# Patient Record
Sex: Male | Born: 1960 | Race: Black or African American | Hispanic: No | Marital: Single | Smoking: Current every day smoker
Health system: Southern US, Community
[De-identification: ages and names within clinical notes are randomized; demographics above are authoritative.]

## PROBLEM LIST (undated history)

## (undated) DIAGNOSIS — S62109A Fracture of unspecified carpal bone, unspecified wrist, initial encounter for closed fracture: Secondary | ICD-10-CM

---

## 2007-07-20 ENCOUNTER — Emergency Department (HOSPITAL_COMMUNITY): Admission: EM | Admit: 2007-07-20 | Discharge: 2007-07-20 | Payer: Self-pay | Admitting: Emergency Medicine

## 2012-01-30 ENCOUNTER — Emergency Department (HOSPITAL_COMMUNITY): Payer: Self-pay

## 2012-01-30 ENCOUNTER — Encounter (HOSPITAL_COMMUNITY): Payer: Self-pay | Admitting: *Deleted

## 2012-01-30 ENCOUNTER — Emergency Department (HOSPITAL_COMMUNITY)
Admission: EM | Admit: 2012-01-30 | Discharge: 2012-01-31 | Disposition: A | Discharge status: Home / Self Care | Payer: Self-pay | Attending: Emergency Medicine | Admitting: Emergency Medicine

## 2012-01-30 DIAGNOSIS — W19XXXA Unspecified fall, initial encounter: Secondary | ICD-10-CM | POA: Insufficient documentation

## 2012-01-30 DIAGNOSIS — S62009A Unspecified fracture of navicular [scaphoid] bone of unspecified wrist, initial encounter for closed fracture: Secondary | ICD-10-CM | POA: Insufficient documentation

## 2012-01-30 DIAGNOSIS — Y9302 Activity, running: Secondary | ICD-10-CM | POA: Insufficient documentation

## 2012-01-30 DIAGNOSIS — M25539 Pain in unspecified wrist: Secondary | ICD-10-CM | POA: Insufficient documentation

## 2012-01-30 DIAGNOSIS — F17200 Nicotine dependence, unspecified, uncomplicated: Secondary | ICD-10-CM | POA: Insufficient documentation

## 2012-01-30 NOTE — ED Notes (Signed)
Injury of the lt wrist he fell while running earlier today

## 2012-01-30 NOTE — ED Notes (Signed)
Patient stated approx 8 hours ago he was running and fell and attempted to catch himself with his left hand.  Wrist slightly deformed and painful.  +pulses and good CMS  Ice pack on arm

## 2012-01-31 MED ORDER — OXYCODONE-ACETAMINOPHEN 5-325 MG PO TABS
1.0000 | ORAL_TABLET | Freq: Once | ORAL | Status: AC
  Administered 2012-01-31: 1 via ORAL
  Filled 2012-01-31: qty 1

## 2012-01-31 MED ORDER — OXYCODONE-ACETAMINOPHEN 5-325 MG PO TABS: 1.0000 | ORAL_TABLET | Freq: Four times a day (QID) | ORAL | Status: AC | PRN

## 2012-01-31 NOTE — Discharge Instructions (Signed)
Extremity Fracture Broken bones (fractures) take several weeks to months to heal depending on the bone involved. The broken ends must be lined up correctly and kept in position for proper healing. Do not remove the splint, immobilizer, or cast that has been applied to treat your injury. This is the most important part of your treatment. Other measures to treat fractures include:  Keeping the injured limb at rest and elevated above your heart as recommended by your caregiver. This will help reduce pain and swelling.   Ice packs can be applied to your fracture site for 20-30 minutes every 3-4 hours over the next 2-3 days.   Pain medicine may be prescribed in the first days after a fracture.  SEEK IMMEDIATE MEDICAL CARE IF:  You develop increasing pain or pressure in the injured limb, or if it becomes cold, numb, or pale.   There is increasing pain with motion of your fingers or toes.  Document Released: 09/29/2004 Document Revised: 08/11/2011 Document Reviewed: 12/10/2008 [facility] [employer] Patient Information 2012 [place], [place]. You have been placed in a splint.  This is a temporizing measure until you can be seen by the specialist, Dr. Nelva Nay, please call in the morning and set an appointment for further evaluation of a more permanent splint/cast being placed

## 2012-01-31 NOTE — ED Provider Notes (Signed)
Medical screening examination/treatment/procedure(s) were performed by non-physician practitioner and as supervising physician I was immediately available for consultation/collaboration.    Vida Roller, MD 01/31/12 [phone]

## 2012-01-31 NOTE — ED Notes (Signed)
Discharge instructions and prescription given  Voiced understanding 

## 2012-01-31 NOTE — ED Provider Notes (Signed)
History     CSN: [id number]  Arrival date & time 01/30/12  2247   First MD Initiated Contact with Patient 01/31/12 0033      Chief Complaint  Patient presents with  . Wrist Pain    (Consider location/radiation/quality/duration/timing/severity/associated sxs/prior treatment) HPI Comments: Patient states he was running earlier today.  Approximately 8 hours ago, when he fell, catching himself with an outstretched left hand.  Now having pain with movement of his thumb  Patient is a 51 y.o. male presenting with wrist pain. The history is provided by the patient.  Wrist Pain This is a new problem. The current episode started today. The problem occurs constantly. The problem has been unchanged. Associated symptoms include joint swelling. Pertinent negatives include no headaches.    History reviewed. No pertinent past medical history.  History reviewed. No pertinent past surgical history.  No family history on file.  History  Substance Use Topics  . Smoking status: Current Everyday Smoker  . Smokeless tobacco: Not on file  . Alcohol Use: Yes      Review of Systems  Musculoskeletal: Positive for joint swelling.  Skin: Negative for wound.  Neurological: Negative for dizziness and headaches.    Allergies  Review of patient's allergies indicates no known allergies.  Home Medications   Current Outpatient Rx  Name Route Sig Dispense Refill  . IBUPROFEN 200 MG PO TABS Oral Take 600 mg by mouth every 6 (six) hours as needed. For pain    . OXYCODONE-ACETAMINOPHEN 5-325 MG PO TABS Oral Take 1 tablet by mouth every 6 (six) hours as needed for pain. 20 tablet 0    BP 120/81  Pulse 101  Temp(Src) 100.1 F (37.8 C) (Oral)  Resp 19  SpO2 96%  Physical Exam  Constitutional: He appears well-developed and well-nourished.  HENT:  Head: Normocephalic.  Neck: Normal range of motion.  Pulmonary/Chest: Effort normal.  Musculoskeletal: He exhibits tenderness. He exhibits no edema.         Arms:   ED Course  Procedures (including critical care time)  Labs Reviewed - No data to display Dg Wrist Complete Left  01/30/2012  *RADIOLOGY REPORT*  Clinical Data: Left wrist pain after fall.  LEFT WRIST - COMPLETE 3+ VIEW  Comparison: None.  Findings: Transverse linear lucency in the waist of the scaphoid consistent with nondisplaced fracture.  Mild degenerative changes in the STT joints and first carpometacarpal joints.  Benign- appearing cyst in the distal scaphoid may be a degenerative cyst. Soft tissue swelling over the dorsum of the wrist.  IMPRESSION: Nondisplaced transverse fracture through the waist of the scaphoid bone.  Original Report Authenticated By: Marlon Pel, M.D.     1. Scaphoid fracture of wrist       MDM   X-ray reveals a transverse fracture of the scaphoid .  That is not displaced.  Will splint, and have patient followup with orthopedics        Arman Filter, NP 01/31/12 0344  Arman Filter, NP 01/31/12 [phone]

## 2012-03-09 DIAGNOSIS — M25539 Pain in unspecified wrist: Secondary | ICD-10-CM | POA: Insufficient documentation

## 2012-03-09 DIAGNOSIS — F17200 Nicotine dependence, unspecified, uncomplicated: Secondary | ICD-10-CM | POA: Insufficient documentation

## 2012-03-10 ENCOUNTER — Emergency Department (HOSPITAL_COMMUNITY): Payer: Self-pay

## 2012-03-10 ENCOUNTER — Encounter (HOSPITAL_COMMUNITY): Payer: Self-pay | Admitting: Emergency Medicine

## 2012-03-10 ENCOUNTER — Emergency Department (HOSPITAL_COMMUNITY)
Admission: EM | Admit: 2012-03-10 | Discharge: 2012-03-10 | Disposition: A | Discharge status: Home / Self Care | Payer: Self-pay | Attending: Emergency Medicine | Admitting: Emergency Medicine

## 2012-03-10 DIAGNOSIS — M79643 Pain in unspecified hand: Secondary | ICD-10-CM

## 2012-03-10 HISTORY — DX: Fracture of unspecified carpal bone, unspecified wrist, initial encounter for closed fracture: S62.109A

## 2012-03-10 MED ORDER — HYDROCODONE-ACETAMINOPHEN 5-325 MG PO TABS: 1.0000 | ORAL_TABLET | ORAL | Status: AC | PRN

## 2012-03-10 MED ORDER — HYDROCODONE-ACETAMINOPHEN 5-325 MG PO TABS
1.0000 | ORAL_TABLET | Freq: Once | ORAL | Status: AC
  Administered 2012-03-10: 1 via ORAL
  Filled 2012-03-10: qty 1

## 2012-03-10 NOTE — ED Notes (Signed)
Pt has cast on L wrist/forearm.  States he fell tonight and pain is worse. CMS intact.

## 2012-03-10 NOTE — Discharge Instructions (Signed)
You were seen and evaluated for your increased hand pain. At this time x-rays don't show any concerning changes of your bones. Your providers feel you may return home and continue to followup with Dr. Cheree Ditto as planned. Keep your hand elevated to reduce pain and swelling. Return if you develop any increasing pain, swelling of the fingers, or loss of sensations.     RESOURCE GUIDE  Chronic Pain Problems: Contact [facility]  [phone] Patients need to be referred by their primary care doctor.  Insufficient Money for Medicine: Contact United Way:  call "211" or [facility] [phone].  No Primary Care Doctor: - Call [facility]  [phone] - can help you locate a primary care doctor that  accepts your insurance, provides certain services, etc. - Physician Referral Service- [phone]  Agencies that provide inexpensive medical care: - [facility]  [phone] - [facility]  [phone] - [facility]  [phone] - Women's Clinic  [phone] - [facility]  [phone] [facility]  [phone]  Medicaid-accepting [facility] Providers: - [facility]- [address]  [phone], Mon-Fri 9am-7pm, Sat 9am-1pm - [facility]- [address] [place], [address] [place]  [phone] - [facility]- [address], [address] [place]  [phone] [facility]- [address]  [phone] - Renaye Rakers- [address] [place], [address], [phone]  Only accepts [place] patients after they have their name  applied to their card  Self Pay (no insurance) in [place]: - Sickle Cell Patients: Dr Willey Blade, [facility]  [address] [place], [phone] - [facility]- [address] [place]  [phone]       [facility] [place]- [address]       -     [facility]- see information above (Speak to [employer] if you do not have insurance)       -  [facility]- [address] [place], [phone]       -  [facility]- [address],  [phone]       -  [facility]- [address] [phone]       -  Dr Julio Sicks-  [address], [place], [phone]       -  [facility]- [address] [phone]       -  [facility]- [address], [phone], also [address], [phone]       -    [facility]- [address] [place], [phone], 1st & 3rd Saturday   every month, 10am-1pm  1) Find a Doctor and Pay Out of Pocket Although you won't have to find out who is covered by your insurance plan, it is a good idea to ask around and get recommendations. You will then need to call the office and see if the doctor you have chosen will accept you as a new patient and what types of options they offer for patients who are self-pay. Some doctors offer discounts or will set up payment plans for their patients who do not have insurance, but you will need to ask so you aren't surprised when you get to your appointment.  2) Contact Your Local  Health Department Not all health departments have doctors that can see patients for sick visits, but many do, so it is worth a call to see if yours does. If you don't know where your local health department is, you can check in your phone book. The CDC also has a tool to help you locate your state's health department, and many state websites also have listings of all of their local health departments.  3) Find a Walk-in Clinic If your illness is not likely to be very severe or complicated, you may want to try a walk in clinic. These are popping up all over the country in pharmacies, drugstores, and shopping centers. They're usually staffed by nurse practitioners or physician assistants that have been trained to treat common illnesses and complaints.  They're usually fairly quick and inexpensive. However, if you have serious medical issues or chronic medical problems, these are probably not your best option  STD Testing - [facility] Department of [facility] [place], STD Clinic, [address], [place], phone [phone] or [phone].  Monday - Friday, call for an appointment. [facility] Department of [employer], STD Clinic, [address], [place], phone [phone] or [phone].  Monday - Friday, call for an appointment.  Abuse/Neglect: [facility] Child Abuse Hotline (564) 831-4412 [facility] Child Abuse Hotline 279-479-6070 (After Hours)  Emergency Shelter:  [facility] (785)[phone]  Maternity Homes: - Room at the [place] [facility] [phone] - [facility] [phone]  MRSA Hotline #:   409-752-4809  [facility] Resources  [facility] [place]  [employer] [facility] Dept. [address]                 [address]         [address] [place] [address]  Blondell Reveal Phone:  [phone]                                  Phone:  [phone]                   Phone:  [phone]  [facility], [phone] - [phone]       -     [facility] in [place], [address]                                  [phone], [facility] Child Abuse Hotline (816)450-5464 or 778-439-3821 (After Hours)   Behavioral Health Services  Substance Abuse Resources: - [facility] Drug Services  870-286-8687 - [facility] 856-041-0879 - [place] 6018815144 Floydene Flock (941)[phone] - Residential & Outpatient Substance Abuse Program  5617654866  Psychological Services: [facility]  [phone] [facility]  [phone] [facility], [phone] [address] [place], ACCESS LINE: [phone] or [phone], [employer].za  Dental Assistance  If unable to pay or uninsured, contact:  [facility] or [facility]. to become qualified for the adult dental clinic.  Patients with Medicaid: [facility] [phone] [address], [phone] [phone]  If unable to pay, or uninsured, contact [facility] (539)160-0547) or [facility] Department [phone] in [place], [phone] in [facility]) to become qualified for the adult dental clinic  Other Low-Cost Community Dental Services: - [facility]- [address] [place], [place], [place], [address], [phone], Ext. [address], 2nd and 4th Thursday of the month at 6:30am.  10 clients each day by appointment, can sometimes see walk-in patients if someone does not show for an appointment. [facility]- [address] [place], [place], [address], [phone] - [facility]- [address], [place], [place], [address], [phone] - [facility]- [phone] [facility] Health Department- [phone] [facility] Department- [phone]

## 2012-03-10 NOTE — ED Notes (Signed)
PA at bedside.

## 2012-03-10 NOTE — ED Provider Notes (Signed)
History     CSN: [id number]  Arrival date & time 03/09/12  2353   First MD Initiated Contact with Patient 03/10/12 0028      Chief Complaint  Patient presents with  . Wrist Pain    HPI  History provided by the patient. Patient is a 51 year old male with no significant past medical history who presents with complaints of increased pain to his left wrist after a fall. Patient has history of a scaphoid fracture to his left hand that happened 5 weeks ago. Patient was following up with Dr. Cheree Ditto age and is placed in a hard plaster thumb spica is cast. Patient states that last night at approximately 7 to 8 PM patient slipped on wet grass landing and hitting his hand in cast on the ground. Since that time he's had some increasing throbbing pains to the same area around the thumb or his original injury was. Patient denies any swelling of his fingers, numbness of the fingers or paleness of fingers. Patient has used some ibuprofen without significant relief. He denies any other aggravating or alleviating factors. Patient denies any other symptoms or injuries from the fall.   Past Medical History  Diagnosis Date  . Fx wrist     History reviewed. No pertinent past surgical history.  No family history on file.  History  Substance Use Topics  . Smoking status: Current Everyday Smoker  . Smokeless tobacco: Not on file  . Alcohol Use: Yes      Review of Systems  Constitutional: Negative for fever and chills.  Neurological: Negative for weakness and numbness.    Allergies  Review of patient's allergies indicates no known allergies.  Home Medications   Current Outpatient Rx  Name Route Sig Dispense Refill  . IBUPROFEN 200 MG PO TABS Oral Take 600 mg by mouth every 6 (six) hours as needed. For pain      Pulse 79  Temp 97.9 F (36.6 C) (Oral)  Resp 16  SpO2 97%  Physical Exam  Nursing note and vitals reviewed. Constitutional: He appears well-developed and well-nourished.    HENT:  Head: Normocephalic.  Cardiovascular: Normal rate and regular rhythm.   Pulmonary/Chest: Effort normal and breath sounds normal.  Abdominal: Soft.  Musculoskeletal:       Left hand and hard thumb spica cast.    ED Course  Procedures   Dg Wrist Complete Left  03/10/2012  *RADIOLOGY REPORT*  Clinical Data: Fall, the scaphoid fracture several months prior (  LEFT WRIST - COMPLETE 3+ VIEW  Comparison: Plain film 01/30/2012  Findings: No fine detail is obscured by overlying cast material. Previously identified scaphoid fracture is barely visible suggesting interval healing.  Radiocarpal joint is intact.  No clear evidence of acute fracture.  IMPRESSION: No clear evidence of acute fracture in exam somewhat limited by overlying cast material.  Original Report Authenticated By: Genevive Bi, M.D.     1. Hand pain       MDM  1:00AM patient seen and evaluated.  No concerning changes on x-ray. Patient has normal sensation and cap refill the fingers. There is no crack or deformity to cast. At this time we'll discharge home with continued followup with hand specialist as planned.  Angus Seller, PA 03/10/12 0600

## 2012-03-11 NOTE — ED Provider Notes (Signed)
Medical screening examination/treatment/procedure(s) were performed by non-physician practitioner and as supervising physician I was immediately available for consultation/collaboration.    Vida Roller, MD 03/11/12 0730

## 2014-07-08 ENCOUNTER — Emergency Department (HOSPITAL_COMMUNITY)
Admission: EM | Admit: 2014-07-08 | Discharge: 2014-07-08 | Disposition: A | Discharge status: Home / Self Care | Payer: Self-pay | Attending: Emergency Medicine | Admitting: Emergency Medicine

## 2014-07-08 ENCOUNTER — Encounter (HOSPITAL_COMMUNITY): Payer: Self-pay | Admitting: Emergency Medicine

## 2014-07-08 ENCOUNTER — Emergency Department (HOSPITAL_COMMUNITY): Payer: Self-pay

## 2014-07-08 DIAGNOSIS — Y9289 Other specified places as the place of occurrence of the external cause: Secondary | ICD-10-CM | POA: Insufficient documentation

## 2014-07-08 DIAGNOSIS — W01198A Fall on same level from slipping, tripping and stumbling with subsequent striking against other object, initial encounter: Secondary | ICD-10-CM | POA: Insufficient documentation

## 2014-07-08 DIAGNOSIS — W19XXXA Unspecified fall, initial encounter: Secondary | ICD-10-CM

## 2014-07-08 DIAGNOSIS — X58XXXA Exposure to other specified factors, initial encounter: Secondary | ICD-10-CM | POA: Insufficient documentation

## 2014-07-08 DIAGNOSIS — Y990 Civilian activity done for income or pay: Secondary | ICD-10-CM | POA: Insufficient documentation

## 2014-07-08 DIAGNOSIS — Y9389 Activity, other specified: Secondary | ICD-10-CM | POA: Insufficient documentation

## 2014-07-08 DIAGNOSIS — M25562 Pain in left knee: Secondary | ICD-10-CM

## 2014-07-08 DIAGNOSIS — S8992XA Unspecified injury of left lower leg, initial encounter: Secondary | ICD-10-CM | POA: Insufficient documentation

## 2014-07-08 DIAGNOSIS — Z720 Tobacco use: Secondary | ICD-10-CM | POA: Insufficient documentation

## 2014-07-08 DIAGNOSIS — Z8781 Personal history of (healed) traumatic fracture: Secondary | ICD-10-CM | POA: Insufficient documentation

## 2014-07-08 MED ORDER — MELOXICAM 7.5 MG PO TABS: 15.0000 mg | ORAL_TABLET | Freq: Every day | ORAL | Status: AC

## 2014-07-08 MED ORDER — IBUPROFEN 800 MG PO TABS
800.0000 mg | ORAL_TABLET | Freq: Once | ORAL | Status: AC
  Administered 2014-07-08: 800 mg via ORAL
  Filled 2014-07-08: qty 1

## 2014-07-08 NOTE — ED Notes (Signed)
Patient transported to X-ray 

## 2014-07-08 NOTE — ED Notes (Addendum)
Patient reports falling and injuring left knee. C/o pain and slight swelling on left medial knee. Says "I heard something pop when I slid and fell on my knee." Hasn't taken any medications today. Took 2 Tylenol yesterday with no alleviation of symptoms. No prior knee injuries. No LOC. Did not hit head. No other complaints/concerns. Awaiting MD/PA.

## 2014-07-08 NOTE — ED Provider Notes (Signed)
CSN: [id number]     Arrival date & time 07/08/14  1742 History  This chart was scribed for non-physician practitioner Celene Skeenobyn Dyesha Henault, PA-C working with No att. providers found by Conchita ParisNadim Abuhashem, ED Scribe. This patient was seen in WTR6/[facility] and the patient's care was started at 6:27 PM.   Chief Complaint  Patient presents with  . Fall  . Knee Pain    left medial   The history is provided by the patient. No language interpreter was used.    HPI Comments: Craig Hunt is a 53 y.o. male who presents to the Emergency Department complaining of left knee pain. Pt notes falling yesterday. 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 He states he heard a pop when he fell. At the time of the injury he rated it as 10/10 but now it does not hurt unless he walks. Pt states he took two tylenol yesterday which provided no relief. He denies LOC, hitting his head, and ankle pain.   Past Medical History  Diagnosis Date  . Fx wrist    History reviewed. No pertinent past surgical history. History reviewed. No pertinent family history. History  Substance Use Topics  . Smoking status: Current Every Day Smoker  . Smokeless tobacco: Not on file  . Alcohol Use: Yes    Review of Systems  Musculoskeletal: Positive for arthralgias.  Neurological: Negative for headaches.  All other systems reviewed and are negative.   Allergies  Review of patient's allergies indicates no known allergies.  Home Medications   Prior to Admission medications   Medication Sig Start Date End Date Taking? Authorizing Provider  acetaminophen (TYLENOL) 500 MG tablet Take 1,000 mg by mouth every 6 (six) hours as needed for moderate pain (knee pian).   Yes Historical Provider, MD  ibuprofen (ADVIL,MOTRIN) 200 MG tablet Take 600 mg by mouth every 6 (six) hours as needed. For pain    Historical Provider, MD  meloxicam (MOBIC) 7.5 MG tablet Take 2 tablets (15 mg total) by mouth daily. 07/08/14   Antony MaduraKelly Humes, PA-C    BP 112/79 mmHg  Pulse 68  Temp(Src) 98.6 F (37 C) (Oral)  Resp 21  SpO2 100% Physical Exam  Constitutional: He is oriented to person, place, and time. He appears well-developed and well-nourished. No distress.  HENT:  Head: Normocephalic and atraumatic.  Eyes: Conjunctivae and EOM are normal.  Neck: Normal range of motion. Neck supple.  Cardiovascular: Normal rate, regular rhythm, normal heart sounds and intact distal pulses.   Pulmonary/Chest: Effort normal and breath sounds normal.  Musculoskeletal: He exhibits no edema.  L knee TTP medial to patella and medial joint line. No swelling or deformity. Unable to assess ligamentous laxity due to pain. ROM limited due to pain.  Neurological: He is alert and oriented to person, place, and time.  Skin: Skin is warm and dry.  Psychiatric: He has a normal mood and affect. His behavior is normal.  Nursing note and vitals reviewed.   ED Course  Procedures  DIAGNOSTIC STUDIES: Oxygen Saturation is 100% on room air, normal by my interpretation.    COORDINATION OF CARE: 6:35 PM Discussed treatment plan with pt at bedside and pt agreed to plan.   Labs Review Labs Reviewed - No data to display  Imaging Review No results found.   EKG Interpretation None     MDM   Final diagnoses:  Fall  Left knee pain    Patient with left knee pain after injury.  No swelling or deformity. Unable to assess ligamentous laxity due to pain. X-ray pending. Pt signed out to TRW AutomotiveKelly Humes, PA-C at shift change with imaging pending.  I personally performed the services described in this documentation, which was scribed in my presence. The recorded information has been reviewed and is accurate.   Kathrynn SpeedRobyn M Yang Rack, PA-C 07/17/14 1622  Toy BakerAnthony T Allen, MD 07/18/14 [id number]

## 2014-07-08 NOTE — Discharge Instructions (Signed)
RICE: Routine Care for Injuries The routine care of many injuries includes Rest, Ice, Compression, and Elevation (RICE). HOME CARE INSTRUCTIONS  Rest is needed to allow your body to heal. Routine activities can usually be resumed when comfortable. Injured tendons and bones can take up to 6 weeks to heal. Tendons are the cord-like structures that attach muscle to bone.  Ice following an injury helps keep the swelling down and reduces pain.  Put ice in a plastic bag.  Place a towel between your skin and the bag.  Leave the ice on for 15-20 minutes, 3-4 times a day, or as directed by your health care provider. Do this while awake, for the first 24 to 48 hours. After that, continue as directed by your caregiver.  Compression helps keep swelling down. It also gives support and helps with discomfort. If an elastic bandage has been applied, it should be removed and reapplied every 3 to 4 hours. It should not be applied tightly, but firmly enough to keep swelling down. Watch fingers or toes for swelling, bluish discoloration, coldness, numbness, or excessive pain. If any of these problems occur, remove the bandage and reapply loosely. Contact your caregiver if these problems continue.  Elevation helps reduce swelling and decreases pain. With extremities, such as the arms, hands, legs, and feet, the injured area should be placed near or above the level of the heart, if possible. SEEK IMMEDIATE MEDICAL CARE IF:  You have persistent pain and swelling.  You develop redness, numbness, or unexpected weakness.  Your symptoms are getting worse rather than improving after several days. These symptoms may indicate that further evaluation or further X-rays are needed. Sometimes, X-rays may not show a small broken bone (fracture) until 1 week or 10 days later. Make a follow-up appointment with your caregiver. Ask when your X-ray results will be ready. Make sure you get your X-ray results. Document Released:  12/04/2000 Document Revised: 08/27/2013 Document Reviewed: 01/21/2011 ExitCare Patient Information 2015 [employer]. This information is not intended to replace advice given to you by your health care provider. Make sure you discuss any questions you have with your health care provider.  

## 2014-07-08 NOTE — ED Provider Notes (Signed)
2155 - Patient care assumed from Particia Nearingobin Harris, PA-C at shift change. Patient pending x-rays for further evaluation of left knee pain secondary to fall. Imaging reviewed which showed no fracture, dislocation, or bony deformity. Patient has been seen ambulating in and out of the ED since initial evaluation. He ambulated to the parking lot and back without difficulty or assistance. Will still provided crutches PRN, but believe Mobic is appropriate for management of symptoms at this time. He will be referred to ortho for further evaluation as needed. Return precautions provided.   Filed Vitals:   07/08/14 1810  BP: 126/79  Pulse: 92  Temp: 98.5 F (36.9 C)  TempSrc: Oral  Resp: 17  SpO2: 100%   Dg Knee Complete 4 Views Left  07/08/2014   CLINICAL DATA:  Larey SeatFell 1 day ago with twisting left knee injury. Pain throughout the joint.  EXAM: LEFT KNEE - COMPLETE 4+ VIEW  COMPARISON:  None.  FINDINGS: There is no evidence of fracture, dislocation, or joint effusion. There is no evidence of arthropathy or other focal bone abnormality. Soft tissues are unremarkable. Vascular calcifications.  IMPRESSION: No acute bony abnormalities.   Electronically Signed   By: Burman NievesWilliam  Stevens M.D.   On: 07/08/2014 21:51      Antony MaduraKelly Jeston Junkins, PA-C 07/08/14 2200

## 2015-04-23 ENCOUNTER — Emergency Department (HOSPITAL_COMMUNITY)
Admission: EM | Admit: 2015-04-23 | Discharge: 2015-04-23 | Disposition: A | Discharge status: Home / Self Care | Payer: Self-pay | Attending: Emergency Medicine | Admitting: Emergency Medicine

## 2015-04-23 ENCOUNTER — Encounter (HOSPITAL_COMMUNITY): Payer: Self-pay | Admitting: *Deleted

## 2015-04-23 DIAGNOSIS — Z791 Long term (current) use of non-steroidal anti-inflammatories (NSAID): Secondary | ICD-10-CM | POA: Insufficient documentation

## 2015-04-23 DIAGNOSIS — K047 Periapical abscess without sinus: Secondary | ICD-10-CM | POA: Insufficient documentation

## 2015-04-23 DIAGNOSIS — Z87828 Personal history of other (healed) physical injury and trauma: Secondary | ICD-10-CM | POA: Insufficient documentation

## 2015-04-23 DIAGNOSIS — Z720 Tobacco use: Secondary | ICD-10-CM | POA: Insufficient documentation

## 2015-04-23 IMAGING — CR DG KNEE COMPLETE 4+V*L*
4 series · 4 of 4 positions shown · non-contrast
Comparison: None.

CLINICAL DATA: Fell 1 day ago with twisting left knee injury. Pain
throughout the joint.

EXAM:
LEFT KNEE - COMPLETE 4+ VIEW

[t knee ap left]
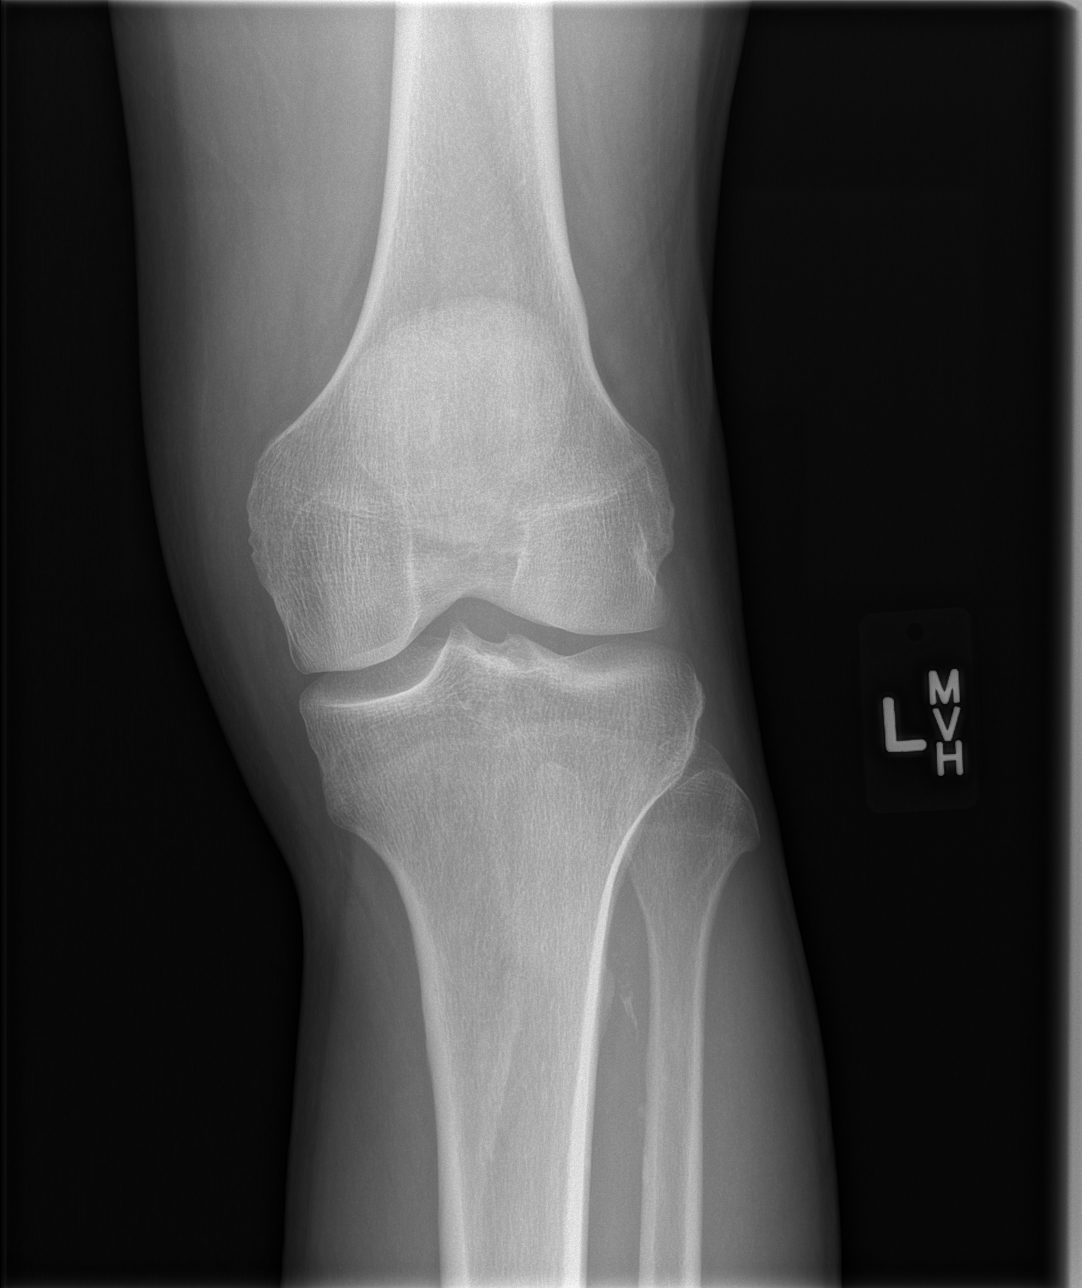

[t knee obl left (1 of 2)]
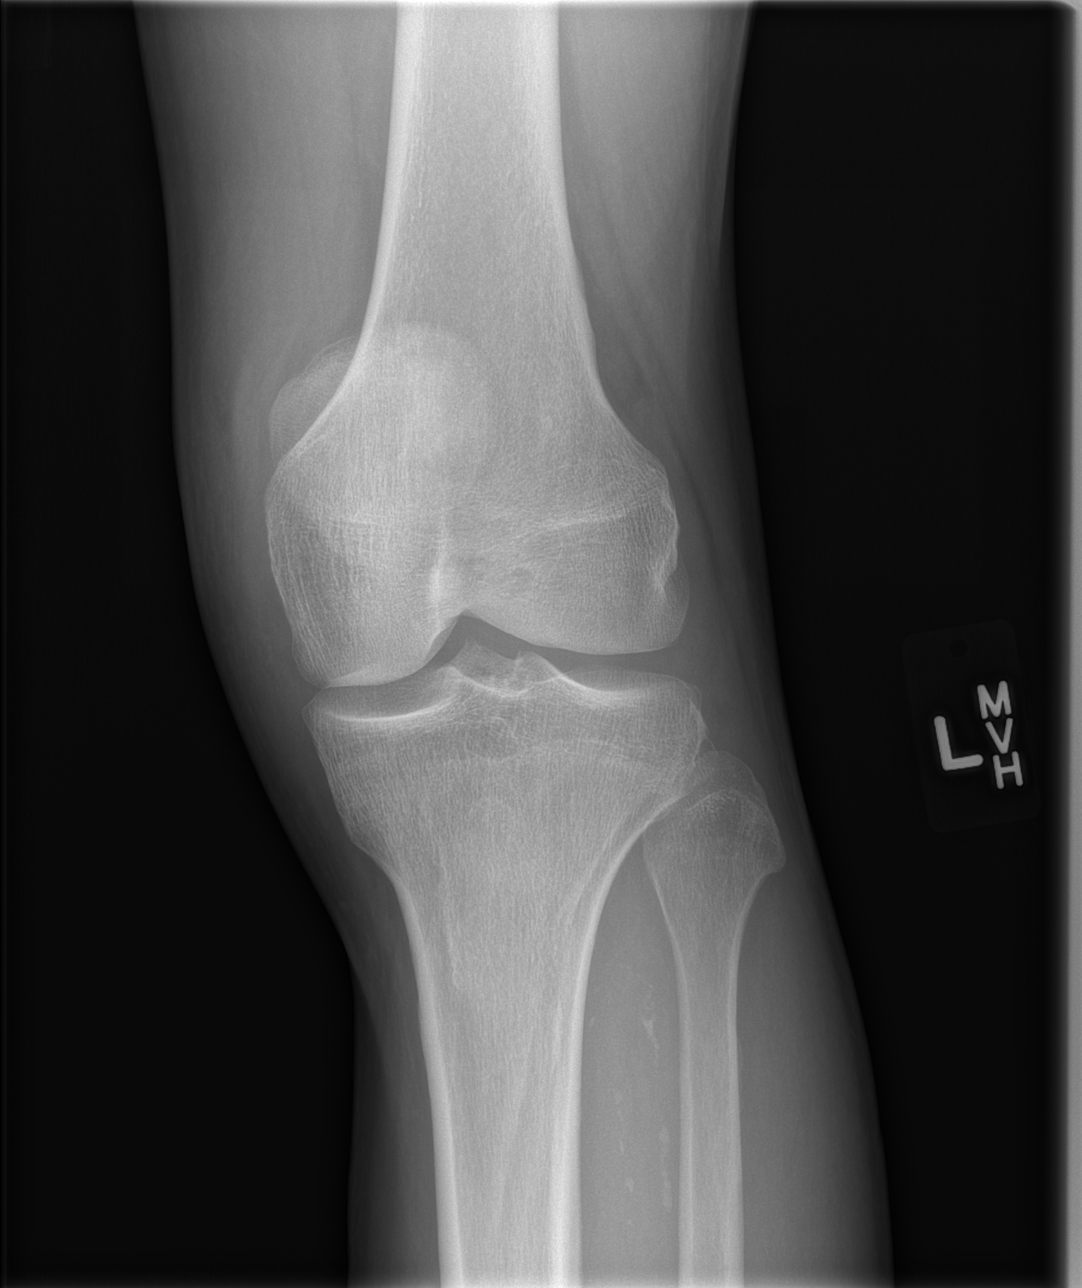

[t knee obl left (2 of 2)]
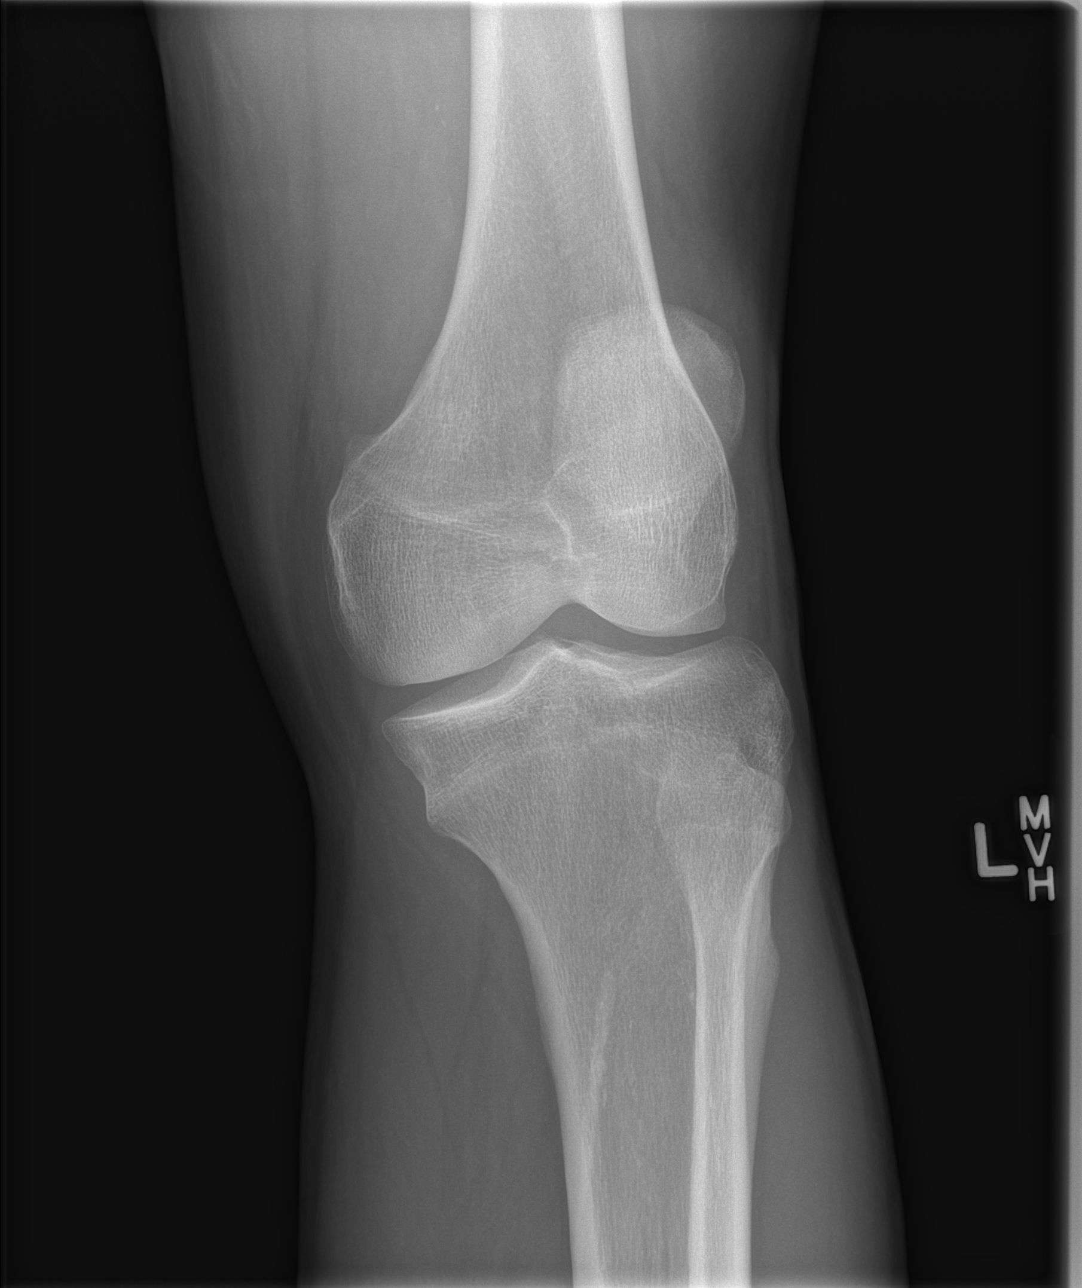

[t knee lat left]
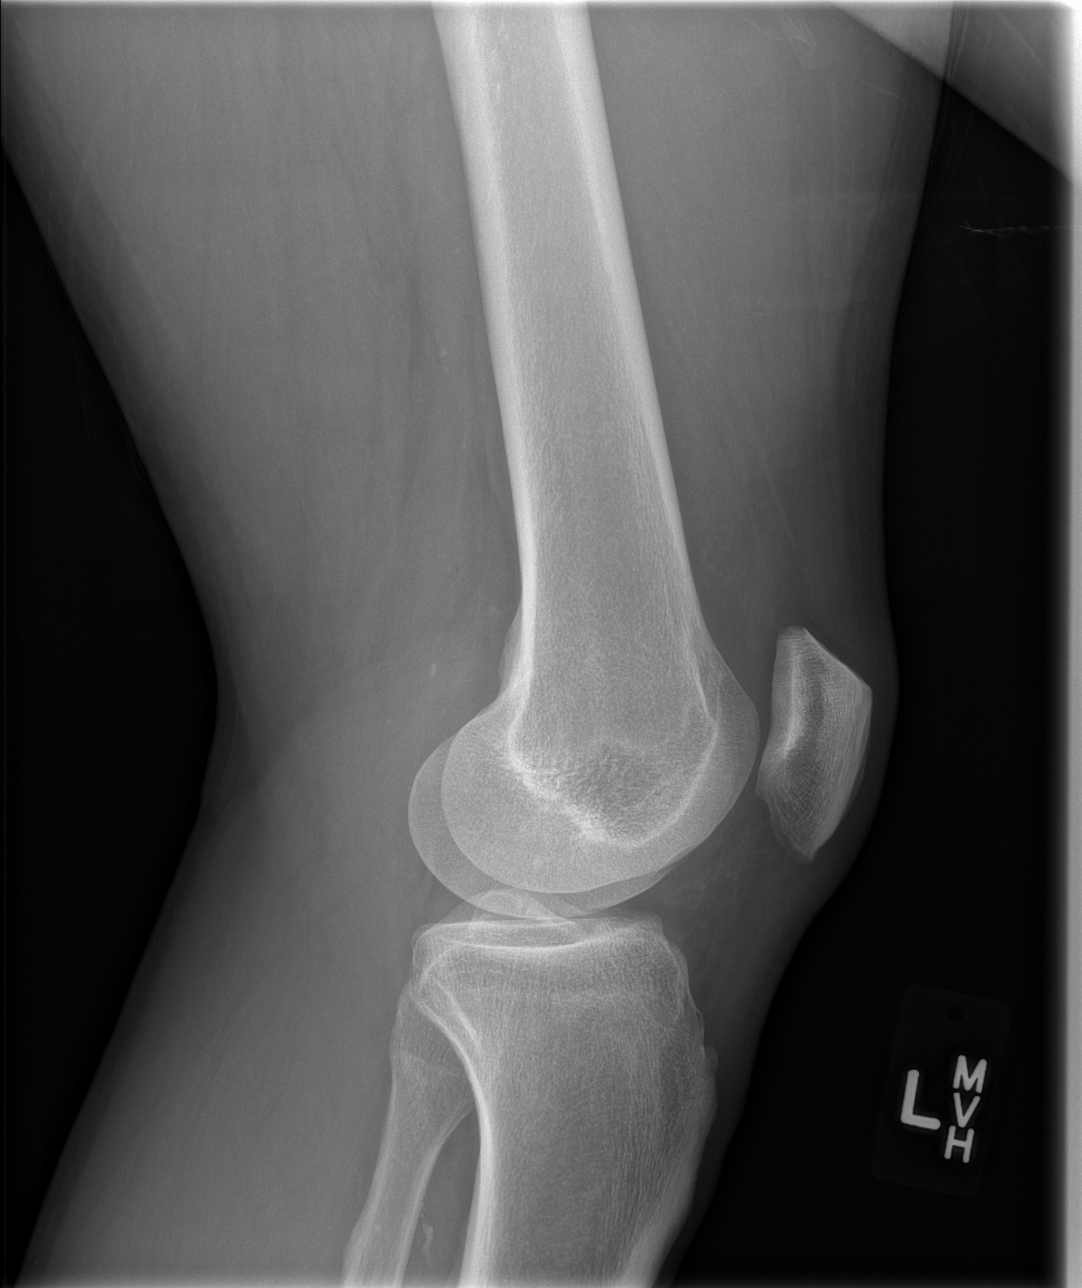

[4 of 4 positions shown; findings below may reference images not displayed]

FINDINGS: There is no evidence of fracture, dislocation, or joint effusion.
There is no evidence of arthropathy or other focal bone abnormality.
Soft tissues are unremarkable. Vascular calcifications.
IMPRESSION: No acute bony abnormalities.

## 2015-04-23 MED ORDER — TRAMADOL HCL 50 MG PO TABS
50.0000 mg | ORAL_TABLET | Freq: Once | ORAL | Status: AC
  Administered 2015-04-23: 50 mg via ORAL
  Filled 2015-04-23: qty 1

## 2015-04-23 MED ORDER — PENICILLIN V POTASSIUM 500 MG PO TABS
500.0000 mg | ORAL_TABLET | Freq: Once | ORAL | Status: AC
  Administered 2015-04-23: 500 mg via ORAL
  Filled 2015-04-23: qty 1

## 2015-04-23 MED ORDER — TRAMADOL HCL 50 MG PO TABS: 50.0000 mg | ORAL_TABLET | Freq: Four times a day (QID) | ORAL | Status: DC | PRN

## 2015-04-23 MED ORDER — PENICILLIN V POTASSIUM 500 MG PO TABS: 500.0000 mg | ORAL_TABLET | Freq: Four times a day (QID) | ORAL | Status: AC

## 2015-04-23 NOTE — ED Notes (Signed)
Pt arrives to the ER via EMS for complaints of right sided gum pain radiating to jaw and rt eye; pt with swelling to gums to rt side and swelling to rt side of face

## 2015-04-23 NOTE — Discharge Instructions (Signed)
Abscessed Tooth An abscessed tooth is an infection around your tooth. It may be caused by holes or damage to the tooth (cavity) or a dental disease. An abscessed tooth causes mild to very bad pain in and around the tooth. See your dentist right away if you have tooth or gum pain. HOME CARE  Take your medicine as told. Finish it even if you start to feel better.  Do not drive after taking pain medicine.  Rinse your mouth (gargle) often with salt water ( teaspoon salt in 8 ounces of warm water).  Do not apply heat to the outside of your face. GET HELP RIGHT AWAY IF:   You have a temperature by mouth above 102 F (38.9 C), not controlled by medicine.  You have chills and a very bad headache.  You have problems breathing or swallowing.  Your mouth will not open.  You develop puffiness (swelling) on the neck or around the eye.  Your pain is not helped by medicine.  Your pain is getting worse instead of better. MAKE SURE YOU:   Understand these instructions.  Will watch your condition.  Will get help right away if you are not doing well or get worse. Document Released: 02/08/2008 Document Revised: 11/14/2011 Document Reviewed: 11/30/2010 [facility] Patient Information 2015 [place], [place]. This information is not intended to replace advice given to you by your health care provider. Make sure you discuss any questions you have with your health care provider. You have been given a referral to the dentist, please call first thing tomorrow morning to set an appointment to be seen.  Please tell them you were referred through the emergency department

## 2015-04-23 NOTE — ED Notes (Signed)
Pt reports L lower dental pain x 2 days.  Swelling noted.

## 2015-04-23 NOTE — ED Provider Notes (Signed)
CSN: [id number]     Arrival date & time 04/23/15  1925 History  This chart was scribed for non-physician practitioner, Arman Filter, NP, working with Linwood Dibbles, MD, by Budd Palmer ED Scribe. This patient was seen in room WTR6/WTR6 and the patient's care was started at 8:09 PM    Chief Complaint  Patient presents with  . Dental Pain   The history is provided by the patient. No language interpreter was used.   HPI Comments: Craig Hunt is a 54 y.o. male brought in by ambulance, who presents to the Emergency Department complaining of right sided gum pain onset 1 day ago. He reports the pain radiating through the jaw and up to the right eye. He has tried salt water rinse and ibuprofen with no relief. He states he usually pays out of pocket.   Past Medical History  Diagnosis Date  . Fx wrist    History reviewed. No pertinent past surgical history. No family history on file. Social History  Substance Use Topics  . Smoking status: Current Every Day Smoker -- 0.50 packs/day    Types: Cigarettes  . Smokeless tobacco: None  . Alcohol Use: Yes     Comment: occ    Review of Systems  HENT: Positive for dental problem and facial swelling.   Neurological: Negative for headaches.    Allergies  Review of patient's allergies indicates no known allergies.  Home Medications   Prior to Admission medications   Medication Sig Start Date End Date Taking? Authorizing Provider  acetaminophen (TYLENOL) 500 MG tablet Take 1,000 mg by mouth every 6 (six) hours as needed for moderate pain (knee pian).    Historical Provider, MD  ibuprofen (ADVIL,MOTRIN) 200 MG tablet Take 600 mg by mouth every 6 (six) hours as needed. For pain    Historical Provider, MD  meloxicam (MOBIC) 7.5 MG tablet Take 2 tablets (15 mg total) by mouth daily. 07/08/14   [name], PA-C  penicillin v potassium (VEETID) 500 MG tablet Take 1 tablet (500 mg total) by mouth 4 (four) times daily. 04/23/15   [name], NP   traMADol (ULTRAM) 50 MG tablet Take 1 tablet (50 mg total) by mouth every 6 (six) hours as needed. 04/23/15   [name], NP   BP 113/72 mmHg  Pulse 88  Temp(Src) 99.1 F (37.3 C) (Oral)  Resp 20  SpO2 98% Physical Exam  Constitutional: He is oriented to person, place, and time. He appears well-developed and well-nourished.  HENT:  Head: Normocephalic.  Right Ear: External ear normal.  Left Ear: External ear normal.  Mouth/Throat:    Neck: Normal range of motion.  Cardiovascular: Normal rate.   Pulmonary/Chest: Effort normal.  Musculoskeletal: Normal range of motion.  Lymphadenopathy:    He has cervical adenopathy.  Neurological: He is alert and oriented to person, place, and time.  Skin: Skin is warm and dry.  Nursing note and vitals reviewed.   ED Course  Procedures  DIAGNOSTIC STUDIES: Oxygen Saturation is 98% on RA, normal by my interpretation.    COORDINATION OF CARE: 8:12 PM - Discussed probable dental abscess. Discussed plans to order antibiotics and pain medication.  Will refer to a dentist. Pt advised of plan for treatment and pt agrees.  Labs Review Labs Reviewed - No data to display  Imaging Review No results found. I have personally reviewed and evaluated these images and lab results as part of my medical decision-making.   EKG Interpretation None  She has a dental abscess.  He will be started on anti-biotic penicillin and Ultram for pain and given a referral to dentist MDM   Final diagnoses:  Dental abscess    I personally performed the services described in this documentation, which was scribed in my presence. The recorded information has been reviewed and is accurate.  [name], NP 04/23/15 [id number]  Linwood Dibbles, MD 04/24/15 [phone]

## 2015-12-26 ENCOUNTER — Ambulatory Visit (INDEPENDENT_AMBULATORY_CARE_PROVIDER_SITE_OTHER): Payer: Self-pay | Admitting: Family Medicine

## 2015-12-26 VITALS — BP 124/82 | HR 95 | Temp 97.9°F | Resp 14 | Ht 73.0 in | Wt 213.0 lb

## 2015-12-26 DIAGNOSIS — Z021 Encounter for pre-employment examination: Secondary | ICD-10-CM

## 2015-12-26 DIAGNOSIS — Z024 Encounter for examination for driving license: Secondary | ICD-10-CM

## 2015-12-26 NOTE — Patient Instructions (Signed)
     IF you received an x-ray today, you will receive an invoice from [facility]. Please contact [facility] at [phone] with questions or concerns regarding your invoice.   IF you received labwork today, you will receive an invoice from [facility]. Please contact [facility] at [phone] with questions or concerns regarding your invoice.   Our billing staff will not be able to assist you with questions regarding bills from these companies.  You will be contacted with the lab results as soon as they are available. The fastest way to get your results is to activate your My Chart account. Instructions are located on the last page of this paperwork. If you have not heard from us regarding the results in 2 weeks, please contact this office.      

## 2015-12-26 NOTE — Progress Notes (Signed)
Airline pilotCommercial Driver Medical Examination   Orpah MelterKennith C Hunt is a 55 y.o. male who presents today for a commercial driver fitness determination physical exam. The patient reports no problems. The following portions of the patient's history were reviewed and updated as appropriate: allergies, current medications, past family history, past medical history, past social history, past surgical history and problem list. Review of Systems A comprehensive review of systems was negative.   Objective:    Vision:  Visual Acuity Screening   Right eye Left eye Both eyes  Without correction: 20/30 20/40 20/25   With correction:     Comments: Peripheral Vision: Right eye 85 degrees. Left eye 85 degrees.  The patient can distinguish the colors red, amber and green.   Applicant can recognize and distinguish among traffic control signals and devices showing standard red, green, and amber colors.     Monocular Vision?: No   Hearing:   Hearing Screening Comments: The patient was able to hear a forced whisper from 10 feet.      BP 124/82 mmHg  Pulse 95  Temp(Src) 97.9 F (36.6 C)  Resp 14  Ht 6\' 1"  (1.854 m)  Wt 213 lb (96.616 kg)  BMI 28.11 kg/m2  SpO2 97%  General Appearance:    Alert, cooperative, no distress, appears stated age  Head:    Normocephalic, without obvious abnormality, atraumatic  Eyes:    PERRL, conjunctiva/corneas clear, EOM's intact, fundi    benign, both eyes       Ears:    Normal TM's and external ear canals, both ears  Nose:   Nares normal, septum midline, mucosa normal, no drainage    or sinus tenderness  Throat:   Lips, mucosa, and tongue normal; teeth and gums normal  Neck:   Supple, symmetrical, trachea midline, no adenopathy;       thyroid:  No enlargement/tenderness/nodules; no carotid   bruit or JVD  Back:     Symmetric, no curvature, ROM normal, no CVA tenderness  Lungs:     Clear to auscultation bilaterally, respirations unlabored  Chest wall:    No  tenderness or deformity  Heart:    Regular rate and rhythm, S1 and S2 normal, no murmur, rub   or gallop  Abdomen:     Soft, non-tender, bowel sounds active all four quadrants,    no masses, no organomegaly        Extremities:   Extremities normal, atraumatic, no cyanosis or edema  Pulses:   2+ and symmetric all extremities  Skin:   Skin color, texture, turgor normal, no rashes or lesions  Lymph nodes:   Cervical, supraclavicular, and axillary nodes normal  Neurologic:   CNII-XII intact. Normal strength, sensation and reflexes      throughout    Labs: No results found for: SPECGRAV, PROTEINUR, BILIRUBINUR, GLUCOSEU   SG 1.010 neg prog, neg bld, neg sugar Assessment:    Healthy male exam.  Meets standards in 2249 CFR 391.41;  qualifies for 2 year certificate.    Plan:    Medical examiners certificate completed and printed. Return as needed.     Has optometry appt scheduled

## 2020-06-20 ENCOUNTER — Encounter (HOSPITAL_COMMUNITY): Payer: Self-pay

## 2020-06-20 ENCOUNTER — Emergency Department (HOSPITAL_COMMUNITY)
Admission: EM | Admit: 2020-06-20 | Discharge: 2020-06-20 | Disposition: A | Discharge status: Home / Self Care | Payer: Self-pay | Attending: Emergency Medicine | Admitting: Emergency Medicine

## 2020-06-20 ENCOUNTER — Other Ambulatory Visit: Payer: Self-pay

## 2020-06-20 DIAGNOSIS — F1994 Other psychoactive substance use, unspecified with psychoactive substance-induced mood disorder: Secondary | ICD-10-CM

## 2020-06-20 DIAGNOSIS — F17210 Nicotine dependence, cigarettes, uncomplicated: Secondary | ICD-10-CM | POA: Insufficient documentation

## 2020-06-20 DIAGNOSIS — Z20822 Contact with and (suspected) exposure to covid-19: Secondary | ICD-10-CM | POA: Insufficient documentation

## 2020-06-20 DIAGNOSIS — F1910 Other psychoactive substance abuse, uncomplicated: Secondary | ICD-10-CM | POA: Insufficient documentation

## 2020-06-20 DIAGNOSIS — R45851 Suicidal ideations: Secondary | ICD-10-CM | POA: Insufficient documentation

## 2020-06-20 DIAGNOSIS — F1410 Cocaine abuse, uncomplicated: Secondary | ICD-10-CM | POA: Insufficient documentation

## 2020-06-20 LAB — CBC WITH DIFFERENTIAL/PLATELET
Abs Immature Granulocytes: 0.03 10*3/uL (ref 0.00–0.07)
Basophils Absolute: 0.1 10*3/uL (ref 0.0–0.1)
Basophils Relative: 1 %
Eosinophils Absolute: 0.2 10*3/uL (ref 0.0–0.5)
Eosinophils Relative: 2 %
HCT: 38.6 % — ABNORMAL LOW (ref 39.0–52.0)
Hemoglobin: 12 g/dL — ABNORMAL LOW (ref 13.0–17.0)
Immature Granulocytes: 0 %
Lymphocytes Relative: 16 %
Lymphs Abs: 1.2 10*3/uL (ref 0.7–4.0)
MCH: 31.3 pg (ref 26.0–34.0)
MCHC: 31.1 g/dL (ref 30.0–36.0)
MCV: 100.5 fL — ABNORMAL HIGH (ref 80.0–100.0)
Monocytes Absolute: 0.7 10*3/uL (ref 0.1–1.0)
Monocytes Relative: 10 %
Neutro Abs: 5.3 10*3/uL (ref 1.7–7.7)
Neutrophils Relative %: 71 %
Platelets: 174 10*3/uL (ref 150–400)
RBC: 3.84 MIL/uL — ABNORMAL LOW (ref 4.22–5.81)
RDW: 13.6 % (ref 11.5–15.5)
WBC: 7.4 10*3/uL (ref 4.0–10.5)
nRBC: 0 % (ref 0.0–0.2)

## 2020-06-20 LAB — BASIC METABOLIC PANEL
Anion gap: 4 — ABNORMAL LOW (ref 5–15)
BUN: 14 mg/dL (ref 6–20)
CO2: 27 mmol/L (ref 22–32)
Calcium: 9 mg/dL (ref 8.9–10.3)
Chloride: 110 mmol/L (ref 98–111)
Creatinine, Ser: 0.96 mg/dL (ref 0.61–1.24)
GFR, Estimated: >60 mL/min (ref >60)
Glucose, Bld: 114 mg/dL — ABNORMAL HIGH (ref 70–99)
Potassium: 4.1 mmol/L (ref 3.5–5.1)
Sodium: 141 mmol/L (ref 135–145)

## 2020-06-20 LAB — RAPID URINE DRUG SCREEN, HOSP PERFORMED
Amphetamines: NOT DETECTED
Barbiturates: NOT DETECTED
Benzodiazepines: NOT DETECTED
Cocaine: POSITIVE — AB
Opiates: NOT DETECTED
Tetrahydrocannabinol: POSITIVE — AB

## 2020-06-20 LAB — RESPIRATORY PANEL BY RT PCR (FLU A&B, COVID)
Influenza A by PCR: NEGATIVE
Influenza B by PCR: NEGATIVE
SARS Coronavirus 2 by RT PCR: NEGATIVE

## 2020-06-20 LAB — ETHANOL: Alcohol, Ethyl (B): <10 mg/dL (ref <10)

## 2020-06-20 NOTE — Progress Notes (Signed)
CSW attempted to reach staff at [facility] in [place] and [place] - both offices closed for the weekend.  Edwin Dada, MSW, LCSW-A Transitions of Care  Clinical Social Worker  [facility] Emergency Departments  Medical ICU [phone]

## 2020-06-20 NOTE — ED Triage Notes (Signed)
patient states he has an addiction problem to crack cocaine and last used 2-3 hours ago. Patient also states that he last used alcohol a couple of hours ago as well.  Patient denies any SI/HI, and visual or auditory hallucinations.

## 2020-06-20 NOTE — ED Notes (Signed)
Tried to call report to [facility], they stated  That they can not take any pts until after 7 pm due to pts waiting and being overwhelmed.

## 2020-06-20 NOTE — Discharge Instructions (Addendum)
It was our pleasure to provide your ER care today - we hope that you feel better.  Avoid cocaine and/or other drug use - see resource guide provided for community treatment center options.  Also see resource guide provided as relates shelters and other social services.   Follow up with primary care doctor in one week.  For mental health issues and/or crisis, you may go directly to the Behavioral Health Urgent Care, it is open 24/7.   Return to ER if worse, new symptoms, new or severe pain, trouble breathing, or other concern.

## 2020-06-20 NOTE — BH Assessment (Signed)
Per Money NP patient cannot be transferred to [facility] due to him being in a relationship with another individual that is currently there who is also as a patient.

## 2020-06-20 NOTE — ED Notes (Signed)
ED Provider at bedside. 

## 2020-06-20 NOTE — BH Assessment (Signed)
[facility] Assessment Progress Note  Case was staffed with Arvilla Market NP who recommended patient be observed and monitored. Patient is currently being reviewed for a possible transfer to [facility].

## 2020-06-20 NOTE — ED Notes (Signed)
Pt is in bed with eyes closed. Respirations even and unlabored

## 2020-06-20 NOTE — BH Assessment (Signed)
Assessment Note  Craig Hunt is an 59 y.o. male that presents this date with S/I. Patient denies any immediate plan although states "he doesn't want to go on." Patient denies any H/I or AVH. Patient reports he is currently homeless due to ongoing SA issues. Patient states he "is just done with it all" due to his crack cocaine addiction. Patient reports daily use stating he uses various amounts and "spends every penny he has" on said addiction. Patient also reports sporadic alcohol use although renders limited history in reference to use and time frame on both substances. Patient is observed to be drowsy and has to be prompted to render history. Patient denies any previous attempts or gestures at self harm. Patient denies any current mental health diagnosis although reports current symptoms of depression to include feeling worthless and hopeless. Patient denies any current legal issues or access to firearms. Patient answers just "yes and no" to questions and renders limited history as noted above. Mental health history is limited per chart review.   Per notes on arrival: Patient presents today seeking help regarding crack addiction. He smokes crack on a daily basis. Last use of crack was last night. He reports his only other substance use is occasional alcohol. He had 1 sip of beer last night. He is never had withdrawal, seizures, DTs from alcohol. His goal today is to get into a rehab for crack cocaine addiction. He denies any cough, runny nose, headaches, change in vision, chest pain, shortness of breath, N/V/D, rash. Denies SI and HI.  Patient is observed to be drowsy at the time of assessment and renders limited information. Patient's memory appears to be intact and thoughts organized. Patient will respond to questions although requires prompting. Patient does not appear to be responding to internal stimuli. Patient speaks in a low soft voice that is difficult to understand at times. Case was staffed  with Arvilla Market NP who recommended patient be observed and monitored. Patient is currently being reviewed for a possible transfer to [facility].      Diagnosis: Substance induced mood disorder.   Past Medical History:  Past Medical History:  Diagnosis Date  . Fx wrist     History reviewed. No pertinent surgical history.  Family History:  Family History  Family history unknown: Yes    Social History:  reports that he has been smoking cigarettes. He has been smoking about 0.50 packs per day. He has never used smokeless tobacco. He reports current alcohol use. He reports current drug use. Drugs: Cocaine and Marijuana.  Additional Social History:  Alcohol / Drug Use Pain Medications: See MAR Prescriptions: See MAR Over the Counter: See MAR History of alcohol / drug use?: Yes Longest period of sobriety (when/how long): Unknown Negative Consequences of Use:  (Denies) Withdrawal Symptoms:  (Denies) Substance #1 Name of Substance 1: Crack (cocaine) 1 - Age of First Use: 24 1 - Amount (size/oz): Varies 1 - Frequency: Varies 1 - Duration: Ongoing 1 - Last Use / Amount: 06/20/20 one half a gram  CIWA: CIWA-Ar BP: 109/61 Pulse Rate: 72 COWS:    Allergies: No Known Allergies  Home Medications: (Not in a hospital admission)   OB/GYN Status:  No LMP for male patient.  General Assessment Data Location of Assessment: [facility] TTS Assessment: In system Is this a Tele or Face-to-Face Assessment?: Face-to-Face Is this an Initial Assessment or a Re-assessment for this encounter?: Initial Assessment Patient Accompanied by:: N/A Language Other than English: No Living Arrangements: Other (  Comment) What gender do you identify as?: Male Date Telepsych consult ordered in [facility]: 06/20/20 Marital status: Single Living Arrangements: Alone Can pt return to current living arrangement?: Yes Admission Status: Voluntary Is patient capable of signing voluntary admission?: Yes Referral Source:  Self/Family/Friend Insurance type: SP  Medical Screening Exam [facility] Walk-in ONLY) Medical Exam completed: Yes  Crisis Care Plan Living Arrangements: Alone Name of Psychiatrist: None Name of Therapist: None  Education Status Is patient currently in school?: No Is the patient employed, unemployed or receiving disability?: Unemployed  Risk to self with the past 6 months Suicidal Ideation: Yes-Currently Present Has patient been a risk to self within the past 6 months prior to admission? : No Suicidal Intent: No Has patient had any suicidal intent within the past 6 months prior to admission? : No Is patient at risk for suicide?: Yes Suicidal Plan?: No Has patient had any suicidal plan within the past 6 months prior to admission? : No Access to Means: No What has been your use of drugs/alcohol within the last 12 months?: Current use  Previous Attempts/Gestures: No How many times?: 0 Other Self Harm Risks: NA Triggers for Past Attempts:  (NA) Intentional Self Injurious Behavior: None Family Suicide History: No Recent stressful life event(s): Other (Comment) (Excessive SA use) Persecutory voices/beliefs?: No Depression: Yes Depression Symptoms: Feeling worthless/self pity Substance abuse history and/or treatment for substance abuse?: No Suicide prevention information given to non-admitted patients: Not applicable  Risk to Others within the past 6 months Homicidal Ideation: No Does patient have any lifetime risk of violence toward others beyond the six months prior to admission? : No Thoughts of Harm to Others: No Current Homicidal Intent: No Current Homicidal Plan: No Access to Homicidal Means: No Identified Victim: NA History of harm to others?: No Assessment of Violence: None Noted Violent Behavior Description: NA Does patient have access to weapons?: No Criminal Charges Pending?: No Does patient have a court date: No Is patient on probation?:  No  Psychosis Hallucinations: None noted Delusions: None noted  Mental Status Report Appearance/Hygiene: Unremarkable Eye Contact: Fair Motor Activity: Freedom of movement Speech: Slow, Slurred Level of Consciousness: Drowsy Mood: Depressed Affect: Appropriate to circumstance Anxiety Level: Minimal Thought Processes: Coherent, Relevant Judgement: Partial Orientation: Person, Place, Time, Situation Obsessive Compulsive Thoughts/Behaviors: None  Cognitive Functioning Concentration: Normal Memory: Recent Intact, Remote Intact Is patient IDD: No Insight: Fair Impulse Control: Fair Appetite: Good Have you had any weight changes? : No Change Sleep: No Change Total Hours of Sleep: 7 Vegetative Symptoms: None  ADLScreening [facility] Assessment Services) Patient's cognitive ability adequate to safely complete daily activities?: Yes Patient able to express need for assistance with ADLs?: Yes Independently performs ADLs?: Yes (appropriate for developmental age)  Prior Inpatient Therapy Prior Inpatient Therapy: No  Prior Outpatient Therapy Prior Outpatient Therapy: No Does patient have an ACCT team?: No Does patient have Intensive In-House Services?  : No Does patient have [facility] services? : No Does patient have P4CC services?: No  ADL Screening (condition at time of admission) Patient's cognitive ability adequate to safely complete daily activities?: Yes Is the patient deaf or have difficulty hearing?: No Does the patient have difficulty seeing, even when wearing glasses/contacts?: No Does the patient have difficulty concentrating, remembering, or making decisions?: No Patient able to express need for assistance with ADLs?: Yes Does the patient have difficulty dressing or bathing?: No Independently performs ADLs?: Yes (appropriate for developmental age) Does the patient have difficulty walking or climbing stairs?: No Weakness  of Legs: None Weakness of Arms/Hands:  None  Home Assistive Devices/Equipment Home Assistive Devices/Equipment: None  Therapy Consults (therapy consults require a physician order) PT Evaluation Needed: No OT Evalulation Needed: No SLP Evaluation Needed: No Abuse/Neglect Assessment (Assessment to be complete while patient is alone) Abuse/Neglect Assessment Can Be Completed: Yes Physical Abuse: Denies Verbal Abuse: Denies Sexual Abuse: Denies Exploitation of patient/patient's resources: Denies Self-Neglect: Denies Values / Beliefs Cultural Requests During Hospitalization: None Spiritual Requests During Hospitalization: None Consults Spiritual Care Consult Needed: No Transition of Care Team Consult Needed: No Advance Directives (For Healthcare) Does Patient Have a Medical Advance Directive?: No Would patient like information on creating a medical advance directive?: No - Patient declined          Disposition: Case was staffed with Arvilla Market NP who recommended patient be observed and monitored. Patient is currently being reviewed for a possible transfer to [facility].      Disposition Initial Assessment Completed for this Encounter: Yes  On Site Evaluation by:   Reviewed with Physician:    Alfredia Ferguson 06/20/2020 1:23 PM

## 2020-06-20 NOTE — ED Provider Notes (Signed)
[facility] DEPT Provider Note   CSN: [id number] Arrival date & time: 06/20/20  0825     History Chief Complaint  Patient presents with  . Addiction Problem    Craig Hunt is a 59 y.o. male.  59 year old man presents today seeking help regarding crack addiction.  He smokes crack on a daily basis.  Last use of crack was last night.  He reports his only other substance use is occasional alcohol.  He had 1 sip of beer last night.  He is never had withdrawal, seizures, DTs from alcohol.  His goal today is to get into a rehab for crack cocaine addiction.  He denies any cough, runny nose, headaches, change in vision, chest pain, shortness of breath, N/V/D, rash.  Denies SI and HI.        Past Medical History:  Diagnosis Date  . Fx wrist     There are no problems to display for this patient.   History reviewed. No pertinent surgical history.     Family History  Family history unknown: Yes    Social History   Tobacco Use  . Smoking status: Current Every Day Smoker    Packs/day: 0.50    Types: Cigarettes  . Smokeless tobacco: Never Used  Vaping Use  . Vaping Use: Never used  Substance Use Topics  . Alcohol use: Yes    Comment: occ  . Drug use: Yes    Types: Cocaine, Marijuana    Home Medications Prior to Admission medications   Medication Sig Start Date End Date Taking? Authorizing Provider  acetaminophen (TYLENOL) 500 MG tablet Take 1,000 mg by mouth every 6 (six) hours as needed for moderate pain (knee pian).    [provider]  ibuprofen (ADVIL,MOTRIN) 200 MG tablet Take 600 mg by mouth every 6 (six) hours as needed. For pain    [provider]  meloxicam (MOBIC) 7.5 MG tablet Take 2 tablets (15 mg total) by mouth daily. 07/08/14   Antonietta Breach, PA-C  penicillin v potassium (VEETID) 500 MG tablet Take 1 tablet (500 mg total) by mouth 4 (four) times daily. 04/23/15   Junius Creamer, NP    Allergies    Patient  has no known allergies.  Review of Systems   Review of Systems  Constitutional: Negative for chills and fever.  HENT: Negative for congestion and rhinorrhea.   Eyes: Negative for visual disturbance.  Respiratory: Negative for cough and shortness of breath.   Cardiovascular: Negative for chest pain.  Gastrointestinal: Negative for abdominal pain, constipation, diarrhea, nausea and vomiting.  Musculoskeletal: Negative for arthralgias.  Psychiatric/Behavioral: Negative for confusion and suicidal ideas.  All other systems reviewed and are negative.   Physical Exam Updated Vital Signs BP 109/61 (BP Location: Left Arm)   Pulse 72   Temp 98 F (36.7 C) (Oral)   Resp 16   Ht 6' (1.829 m)   Wt 83 kg   SpO2 92%   BMI 24.82 kg/m   Physical Exam Vitals and nursing note reviewed.  Constitutional:      General: He is not in acute distress.    Appearance: Normal appearance. He is normal weight. He is not ill-appearing, toxic-appearing or diaphoretic.     Comments: Tired appearing, older black man resting comfortably in bed, NAD  HENT:     Head: Normocephalic.  Eyes:     Conjunctiva/sclera: Conjunctivae normal.  Cardiovascular:     Rate and Rhythm: Normal rate and regular rhythm.  Pulses: Normal pulses.     Heart sounds: Normal heart sounds.  Pulmonary:     Effort: Pulmonary effort is normal.     Breath sounds: Normal breath sounds.  Skin:    General: Skin is warm and dry.  Neurological:     General: No focal deficit present.     Mental Status: He is alert. Mental status is at baseline.  Psychiatric:        Mood and Affect: Mood normal.        Behavior: Behavior normal.     ED Results / Procedures / Treatments   Labs (all labs ordered are listed, but only abnormal results are displayed) Labs Reviewed  CBC WITH DIFFERENTIAL/PLATELET - Abnormal; Notable for the following components:      Result Value   RBC 3.84 (*)    Hemoglobin 12.0 (*)    HCT 38.6 (*)    MCV 100.5  (*)    All other components within normal limits  BASIC METABOLIC PANEL - Abnormal; Notable for the following components:   Glucose, Bld 114 (*)    Anion gap 4 (*)    All other components within normal limits  RAPID URINE DRUG SCREEN, HOSP PERFORMED - Abnormal; Notable for the following components:   Cocaine POSITIVE (*)    Tetrahydrocannabinol POSITIVE (*)    All other components within normal limits  RESPIRATORY PANEL BY RT PCR (FLU A&B, COVID)  ETHANOL    EKG None  Radiology No results found.  Procedures Procedures (including critical care time)  Medications Ordered in ED Medications - No data to display  ED Course  I have reviewed the triage vital signs and the nursing notes.  Pertinent labs & imaging results that were available during my care of the patient were reviewed by me and considered in my medical decision making (see chart for details).    MDM Rules/Calculators/A&P                          59 yo man presenting w/ daily crack use. Denies any other symptoms, no SI/HI, no h/o EtOH withdrawal. Will obtain UDS. Will consult peer counselors for assistance with resources.  On reassessment with Dr. Maryan Rued, patient endorsing SI. When Dr. Maryan Rued asked if he had any mental health concerns, or if he wanted to hurt himself or anyone else, patient stated "I want to hurt myself." Will consult TTS, likely appropriate for Craig Hunt.  TTS met with patient, recommends Craig Hunt.  Awaiting Covid result, then transferred to Craig Hunt.  COVID-19 negative, transferring to Craig Hunt.  Final Clinical Impression(s) / ED Diagnoses Final diagnoses:  Substance abuse ([place])  Cocaine abuse ([place])  Suicidal ideation    Rx / DC Orders ED Discharge Orders    None       Gladys Damme, MD 06/20/20 1455    Blanchie Dessert, MD 06/20/20 1931

## 2020-06-20 NOTE — ED Provider Notes (Signed)
RN indicates [facility] is unable to accept patient.   On reassessment of patient, pt has been observed in ED an additional period of time. Patient has eaten dinner/meal tray. Pt indicates he is feeling much improved. Pt denies any thoughts of harm to self. No SI or HI. He has normal mood and affect. No delusions or hallucinations.   When inquiring about living situation ptt indicates he is 'mainly homeless' - will provide resource guide/social services/shelters.   Also discussed that while we do not offer inpatient detox/rehab for cocaine use/abuse, we will provide resource guide for community resource options for substance abuse treatment.   Pt currently appears stable for d/c.   Also rec pcp f/u.   Return precautions provided.      Cathren Laine, MD 06/20/20 1710

## 2020-06-20 NOTE — ED Notes (Signed)
Pt in bed with eyes closed. Resting comfortably, respirations even and unlabored

## 2020-06-22 ENCOUNTER — Emergency Department (HOSPITAL_COMMUNITY)
Admission: EM | Admit: 2020-06-22 | Discharge: 2020-06-22 | Disposition: A | Discharge status: Home / Self Care | Payer: Self-pay | Attending: Emergency Medicine | Admitting: Emergency Medicine

## 2020-06-22 ENCOUNTER — Other Ambulatory Visit: Payer: Self-pay

## 2020-06-22 ENCOUNTER — Encounter (HOSPITAL_COMMUNITY): Payer: Self-pay | Admitting: Emergency Medicine

## 2020-06-22 DIAGNOSIS — R45851 Suicidal ideations: Secondary | ICD-10-CM | POA: Insufficient documentation

## 2020-06-22 DIAGNOSIS — F1410 Cocaine abuse, uncomplicated: Secondary | ICD-10-CM

## 2020-06-22 DIAGNOSIS — Z20822 Contact with and (suspected) exposure to covid-19: Secondary | ICD-10-CM | POA: Insufficient documentation

## 2020-06-22 DIAGNOSIS — F1420 Cocaine dependence, uncomplicated: Secondary | ICD-10-CM | POA: Insufficient documentation

## 2020-06-22 DIAGNOSIS — F1590 Other stimulant use, unspecified, uncomplicated: Secondary | ICD-10-CM | POA: Insufficient documentation

## 2020-06-22 DIAGNOSIS — F1020 Alcohol dependence, uncomplicated: Secondary | ICD-10-CM | POA: Insufficient documentation

## 2020-06-22 DIAGNOSIS — F17210 Nicotine dependence, cigarettes, uncomplicated: Secondary | ICD-10-CM | POA: Insufficient documentation

## 2020-06-22 LAB — COMPREHENSIVE METABOLIC PANEL
ALT: 17 U/L (ref 0–44)
AST: 17 U/L (ref 15–41)
Albumin: 3.7 g/dL (ref 3.5–5.0)
Alkaline Phosphatase: 85 U/L (ref 38–126)
Anion gap: 8 (ref 5–15)
BUN: 11 mg/dL (ref 6–20)
CO2: 28 mmol/L (ref 22–32)
Calcium: 9.1 mg/dL (ref 8.9–10.3)
Chloride: 106 mmol/L (ref 98–111)
Creatinine, Ser: 0.96 mg/dL (ref 0.61–1.24)
GFR, Estimated: >60 mL/min (ref >60)
Glucose, Bld: 101 mg/dL — ABNORMAL HIGH (ref 70–99)
Potassium: 4.2 mmol/L (ref 3.5–5.1)
Sodium: 142 mmol/L (ref 135–145)
Total Bilirubin: 0.7 mg/dL (ref 0.3–1.2)
Total Protein: 6.6 g/dL (ref 6.5–8.1)

## 2020-06-22 LAB — RAPID URINE DRUG SCREEN, HOSP PERFORMED
Amphetamines: NOT DETECTED
Barbiturates: NOT DETECTED
Benzodiazepines: NOT DETECTED
Cocaine: POSITIVE — AB
Opiates: NOT DETECTED
Tetrahydrocannabinol: POSITIVE — AB

## 2020-06-22 LAB — RESPIRATORY PANEL BY RT PCR (FLU A&B, COVID)
Influenza A by PCR: NEGATIVE
Influenza B by PCR: NEGATIVE
SARS Coronavirus 2 by RT PCR: NEGATIVE

## 2020-06-22 LAB — CBC WITH DIFFERENTIAL/PLATELET
Abs Immature Granulocytes: 0.03 10*3/uL (ref 0.00–0.07)
Basophils Absolute: 0.1 10*3/uL (ref 0.0–0.1)
Basophils Relative: 1 %
Eosinophils Absolute: 0.2 10*3/uL (ref 0.0–0.5)
Eosinophils Relative: 2 %
HCT: 42.5 % (ref 39.0–52.0)
Hemoglobin: 13.4 g/dL (ref 13.0–17.0)
Immature Granulocytes: 0 %
Lymphocytes Relative: 17 %
Lymphs Abs: 1.2 10*3/uL (ref 0.7–4.0)
MCH: 31.8 pg (ref 26.0–34.0)
MCHC: 31.5 g/dL (ref 30.0–36.0)
MCV: 100.7 fL — ABNORMAL HIGH (ref 80.0–100.0)
Monocytes Absolute: 0.6 10*3/uL (ref 0.1–1.0)
Monocytes Relative: 8 %
Neutro Abs: 5.1 10*3/uL (ref 1.7–7.7)
Neutrophils Relative %: 72 %
Platelets: 196 10*3/uL (ref 150–400)
RBC: 4.22 MIL/uL (ref 4.22–5.81)
RDW: 13.7 % (ref 11.5–15.5)
WBC: 7.1 10*3/uL (ref 4.0–10.5)
nRBC: 0 % (ref 0.0–0.2)

## 2020-06-22 LAB — ACETAMINOPHEN LEVEL: Acetaminophen (Tylenol), Serum: <10 ug/mL — ABNORMAL LOW (ref 10–30)

## 2020-06-22 LAB — SALICYLATE LEVEL: Salicylate Lvl: <7 mg/dL — ABNORMAL LOW (ref 7.0–30.0)

## 2020-06-22 LAB — ETHANOL: Alcohol, Ethyl (B): <10 mg/dL (ref <10)

## 2020-06-22 MED ORDER — METOPROLOL TARTRATE 5 MG/5ML IV SOLN
INTRAVENOUS | Status: AC
  Filled 2020-06-22: qty 10

## 2020-06-22 MED ORDER — ADENOSINE 6 MG/2ML IV SOLN
INTRAVENOUS | Status: AC
  Filled 2020-06-22: qty 6

## 2020-06-22 NOTE — Patient Outreach (Signed)
ED Peer Support Specialist Patient Intake (Complete at intake & 30-60 Day Follow-up)  Name: Craig Hunt  MRN: [id number]  Age: 60 y.o.   Date of Admission: 06/22/2020  Intake: Initial Comments:      Primary Reason Admitted: Drug Problem, Suicidal   Lab values: Alcohol/ETOH: Positive Positive UDS? Yes Amphetamines: No Barbiturates: No Benzodiazepines: No Cocaine: Yes Opiates: No Cannabinoids: Yes  Demographic information: Gender: Male Ethnicity: African American Marital Status: Single Insurance Status: Uninsured/Self-pay [employer] (Work Neurosurgeon, Physicist, medical, etc.: Yes Lives with: Alone Living situation: Homeless  Reported Patient History: Patient reported health conditions: Depression Patient aware of HIV and hepatitis status: No  In past year, has patient visited ED for any reason? No  Number of ED visits:    Reason(s) for visit:    In past year, has patient been hospitalized for any reason? No  Number of hospitalizations:    Reason(s) for hospitalization:    In past year, has patient been arrested? No  Number of arrests:    Reason(s) for arrest:    In past year, has patient been incarcerated? No  Number of incarcerations:    Reason(s) for incarceration:    In past year, has patient received medication-assisted treatment? No  In past year, patient received the following treatments:    In past year, has patient received any harm reduction services? No  Did this include any of the following?    In past year, has patient received care from a mental health provider for diagnosis other than SUD? No  In past year, is this first time patient has overdosed? No  Number of past overdoses:    In past year, is this first time patient has been hospitalized for an overdose? No  Number of hospitalizations for overdose(s):    Is patient currently receiving treatment for a mental health diagnosis? No  Patient reports  experiencing difficulty participating in SUD treatment: No    Most important reason(s) for this difficulty?    Has patient received prior services for treatment? No  In past, patient has received services from following agencies:    Plan of Care:  Suggested follow up at these agencies/treatment centers:    Other information:  CPSS met with Pt an was able to gain information to better assist Pt. CPSS was made aware that Pt was not able to get himself off of substance an that he needs help with doing so. CPSS was able to gain understanding of what Pt was seeking. CPSS faxed Pt information to [facility] an are waiting for reply at this time.    Aaron Edelman Mykhia Danish, [place]  06/22/2020 11:53 AM

## 2020-06-22 NOTE — Discharge Instructions (Signed)
Finding Treatment for Addiction Addiction is a complex disease of the brain that causes an uncontrollable (compulsive) need for:  A substance. This includes alcohol, illegal drugs, or prescription medicines, such as painkillers.  An activity or behavior, such as gambling or shopping. Addiction changes the way your brain works. Because of this change:  The need for the medicine, drug, or activity can become so strong that you think about it all the time.  Getting more and more of your addiction becomes the most important thing to you.  You may find yourself leaving other activities and relationships to pursue your addiction.  You can become physically dependent on a substance.  Your health, behavior, emotions, and relationships can change for the worse. How do I know if I need treatment for addiction? Addiction is a progressive disease. Without treatment, addiction can get worse. Living with addiction puts you at higher risk for injury, poor health, loss of employment, loss of money, and even death. You might need treatment for addiction if:  You have tried to stop or cut down, but you have not succeeded.  You find it annoying that your friends and family are concerned about your use or behavior.  You feel guilty about your use or behavior.  You need a particular substance or activity to start your day or to calm down.  You are running out of money because of your addiction.  You have done something illegal to support your addiction.  Your addiction has caused you: ? Health problems. ? Trouble in school, work, home, or with the police. ? To devote all your time to your addiction, and not to other responsibilities. ? To tell lies in order to hide your problem. What types of treatment are available? There may be options for treatment programs and plans based on your addiction, condition, needs, and preferences. No single treatment is right for everyone.  Treatment programs can  be: ? Outpatient. You live at home and go to work or school, but you go to a clinic for treatment. ? Inpatient. You live and sleep at the program facility during treatment.  Programs may include: ? Medicine. You may need medicine to treat the addiction itself, or to treat anxiety or depression. ? Counseling and behavior therapy. This can help individuals and families behave in healthier ways and relate more effectively. ? Support groups. Confidential group therapy, such as a 12-step program, can help individuals and families during treatment and recovery. ? A combination of education, counseling, and a 12-step, spirituality-based approach. What should I consider when selecting a treatment program? Think about your individual requirements when selecting a treatment program. Ask about:  The overall approach to treatment. ? Some programs are strictly 12-step programs. Some have a more flexible approach. ? Programs may differ in length of stay, setting, and size. ? Some programs include your family in your treatment plan. Support may be offered to them throughout the treatment process, as well as instructions for them when you are discharged. ? You may continue to receive support after you have left the program.  The types of medical services that are offered. Find out if the program: ? Offers specific treatment for your particular addiction. ? Meets all of your needs, including physical and cultural needs. ? Includes any medicines you might need. ? Offers mental health counseling as part of your treatment. ? Offers the 12-step meetings at the center, or if transport is available for patients to attend meetings at other locations.  The   cost and types of insurance that are accepted. ? Some programs are sponsored by the government. They support patients who do not have private insurance. ? If you do not have insurance, or if you choose to attend a program that does not accept your insurance,  call the treatment center. Tell them your financial needs and whether a payment plan can be set up. ? There are also organizations that will help you find the resources for treatment. You can find them online by searching "treatment for addiction."  If the program is certified by the appropriate government agency. Where to find support  Your health care provider can help you to find the right treatment. These discussions are confidential.  The National Council on Alcoholism and Drug Dependence (NCADD). This group has information about treatment centers and programs for people who have an addiction and for family members. ? Call: 1-800-NCA-CALL (1-800-622-2255). ? Visit the website: https://www.ncadd.org/  The Substance Abuse and Mental Health Services Administration (SAMHSA). This organization will help you find publicly funded treatment centers, help hotlines, and counseling services near you. ? Call: 1-800-662-HELP (1-800-662-4357). ? Visit the website: www.findtreatment.samhsa.gov  The National Problem Gambling Helpline. This is a 24-hour confidential helpline for gambling addiction. ? Call: 1-800-522-4700 ? Visit the website: https://www.ncpgambling.org/ In countries outside of the [place] and [place], look in local directories for contact information for services in your area. Follow these instructions at home:  Find supportive people who will help you stay away from your addiction and stay sober.  Do not use the substance or engage in the activity.  If you have been through treatment: ? Follow your plan. The plan is usually developed by you and your health care provider during treatment. ? Go to meetings with other people in recovery. ? Avoid people, situations, and things that lead you to do the things you are addicted to (triggers). Summary  Addiction changes the way your brain works. These changes cause a desire to repeat and increase the use of the a substance or  behavior.  Addiction is a progressive disease. Without treatment, addiction can get worse. Living with addiction puts you at higher risk for injury, poor health, loss of employment, loss of money, and even death.  There may be options for treatment programs and plans based on your addiction, condition, needs, and preferences. No single treatment is right for everyone.  Your health care provider can help you to find the right treatment. These discussions are confidential. This information is not intended to replace advice given to you by your health care provider. Make sure you discuss any questions you have with your health care provider. Document Revised: 05/15/2019 Document Reviewed: 09/20/2017 Elsevier Patient Education  2020 Elsevier Inc.  

## 2020-06-22 NOTE — ED Triage Notes (Signed)
Pt requesting detox from crack cocaine. Also reports that he uses marijuana and had "a couple beers" a few hours ago. Last used crack a few hours ago as well. Denies SI/HI or hallucinations.

## 2020-06-22 NOTE — ED Provider Notes (Signed)
[facility]-EMERGENCY DEPT Provider Note   CSN: [id number] Arrival date & time: 06/22/20  0600    \ History Chief Complaint  Patient presents with   Drug Problem   Suicidal    Craig Hunt is a 59 y.o. male with no significant  past medical history who presents for evaluation of requesting detox.  Smokes crack cocaine on a daily basis.  Last use 3 hours PTA.  He occasionally drinks alcohol however denies any prior histories of DTs or withdrawal seizures.  Denies headache, lightness, dizziness, chest pain, shortness of breath, abdominal pain, diarrhea, dysuria, paresthesias.  Denies HI, AVH.  Admits to passive SI stating "I does not want to be here anymore.  He admits to plan however would not expound on this.  Positional rating relieving factors.  Patient was seen here in the emergency department 2 days ago.  TTS recommended overnight observation.  Plan was to transfer to rehab however according to counselor patient cannot be transferred due to significant other currently being housed at [facility].  History obtained from patient and past medical records.  No interpreter is used.  HPI     Past Medical History:  Diagnosis Date   Fx wrist     There are no problems to display for this patient.   History reviewed. No pertinent surgical history.     Family History  Family history unknown: Yes    Social History   Tobacco Use   Smoking status: Current Every Day Smoker    Packs/day: 0.50    Types: Cigarettes   Smokeless tobacco: Never Used  Vaping Use   Vaping Use: Never used  Substance Use Topics   Alcohol use: Yes    Comment: occ   Drug use: Yes    Types: Cocaine, Marijuana    Home Medications Prior to Admission medications   Medication Sig Start Date End Date Taking? Authorizing Provider  meloxicam (MOBIC) 7.5 MG tablet Take 2 tablets (15 mg total) by mouth daily. Patient not taking: Reported on 06/22/2020 07/08/14   [name], PA-C    penicillin v potassium (VEETID) 500 MG tablet Take 1 tablet (500 mg total) by mouth 4 (four) times daily. Patient not taking: Reported on 06/22/2020 04/23/15   [name], NP    Allergies    Patient has no known allergies.  Review of Systems   Review of Systems  Constitutional: Negative.   HENT: Negative.   Respiratory: Negative.   Cardiovascular: Negative.   Gastrointestinal: Negative.   Genitourinary: Negative.   Musculoskeletal: Negative.   Skin: Negative.   Neurological: Negative.   Psychiatric/Behavioral: Suicidal ideas: Passive SI.  All other systems reviewed and are negative.   Physical Exam Updated Vital Signs BP 131/85 (BP Location: Left Arm)    Pulse 77    Temp 98 F (36.7 C) (Oral)    Resp 18    Ht 6\' 1"  (1.854 m)    Wt 81.6 kg    SpO2 94%    BMI 23.75 kg/m   Physical Exam Vitals and nursing note reviewed.  Constitutional:      General: He is not in acute distress.    Appearance: He is well-developed. He is not ill-appearing, toxic-appearing or diaphoretic.  HENT:     Head: Normocephalic and atraumatic.     Nose: Nose normal.     Mouth/Throat:     Mouth: Mucous membranes are moist.  Eyes:     Pupils: Pupils are equal, round, and reactive  to light.  Cardiovascular:     Rate and Rhythm: Normal rate and regular rhythm.     Pulses: Normal pulses.     Heart sounds: Normal heart sounds.  Pulmonary:     Effort: Pulmonary effort is normal. No respiratory distress.     Breath sounds: Normal breath sounds.  Abdominal:     General: Bowel sounds are normal. There is no distension.     Palpations: Abdomen is soft.  Musculoskeletal:        General: Normal range of motion.     Cervical back: Normal range of motion and neck supple.  Skin:    General: Skin is warm and dry.     Capillary Refill: Capillary refill takes less than 2 seconds.  Neurological:     General: No focal deficit present.     Mental Status: He is alert and oriented to person, place, and time.   Psychiatric:     Comments: Denies HI, AVH.  Admits to "I just want to live anymore."  He admits plan however will not expound on this.  He has depressed mood and flat affect.  Normal speech.  Cooperative behavior.     ED Results / Procedures / Treatments   Labs (all labs ordered are listed, but only abnormal results are displayed) Labs Reviewed  COMPREHENSIVE METABOLIC PANEL - Abnormal; Notable for the following components:      Result Value   Glucose, Bld 101 (*)    All other components within normal limits  RAPID URINE DRUG SCREEN, HOSP PERFORMED - Abnormal; Notable for the following components:   Cocaine POSITIVE (*)    Tetrahydrocannabinol POSITIVE (*)    All other components within normal limits  CBC WITH DIFFERENTIAL/PLATELET - Abnormal; Notable for the following components:   MCV 100.7 (*)    All other components within normal limits  ACETAMINOPHEN LEVEL - Abnormal; Notable for the following components:   Acetaminophen (Tylenol), Serum <10 (*)    All other components within normal limits  SALICYLATE LEVEL - Abnormal; Notable for the following components:   Salicylate Lvl <7.0 (*)    All other components within normal limits  RESPIRATORY PANEL BY RT PCR (FLU A&B, COVID)  ETHANOL    EKG None  Radiology No results found.  Procedures Procedures (including critical care time)  Medications Ordered in ED Medications - No data to display  ED Course  I have reviewed the triage vital signs and the nursing notes.  Pertinent labs & imaging results that were available during my care of the patient were reviewed by me and considered in my medical decision making (see chart for details).  59 year old presents for evaluation of requesting detox from smoking crack cocaine however also admits to passive SI.  Was seen here 2 days ago.  He has no complaints.  Will not expound on his SI however states "I do not want to live anymore."  Plan on imaging, consult with TTS.  Did discuss  with patient that we do not have inpatient detox for crack cocaine.  Labs personally reviewed and interpreted:  CBC without leukocytosis Metabolic panel with electrolyte, renal abnormality Acetaminophen, salicylate, ethanol level negative COVID pending UDS pending however will not effective disposition  Patient reassessed.  He is medically cleared.  Disposition per TTS.  Hold orders placed.  Hemodynamically stable.  Patient was assessed by psychiatry.  He psychiatrically cleared for discharge.  Patient previously medically cleared.  Patient was seen by peers support who has found outpatient treatment  program at [facility] for substance use.  Patient will be discharged with outpatient resources for substance use.  At this time he denies any SI, HI, AVH.    MDM Rules/Calculators/A&P                           Final Clinical Impression(s) / ED Diagnoses Final diagnoses:  Cocaine abuse ([facility])  Suicidal thoughts    Rx / DC Orders ED Discharge Orders    None       Rickita Forstner A, PA-C 06/22/20 1434    Charlynne Pander, MD 06/23/20 [phone]

## 2020-06-22 NOTE — [facility] Counselor (Signed)
Disposition: Per Assunta Found, NP patient does not meet in patient care criteria and is psych cleared. CPSS consult placed.

## 2020-06-22 NOTE — Patient Outreach (Signed)
CPSS was made aware that [facility] will not have a bed available until tomorrow, CPSS contacted [facility] in [place] an were made aware they have a bed available. CPSS informed Pt, an are contacted transportation to transport Pt to facility.

## 2020-06-22 NOTE — ED Notes (Signed)
Patient belongings secured. These included: 1 jacket, 2 backpacks full of clothing, 1 pt belonging bag of clothing, and 1 phone.
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
## 2021-05-17 ENCOUNTER — Other Ambulatory Visit: Payer: Self-pay | Admitting: *Deleted

## 2021-05-17 ENCOUNTER — Other Ambulatory Visit: Payer: Self-pay

## 2021-05-17 DIAGNOSIS — Z125 Encounter for screening for malignant neoplasm of prostate: Secondary | ICD-10-CM
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
## 2021-05-18 ENCOUNTER — Telehealth: Payer: Self-pay

## 2021-05-18 LAB — PSA: Prostate Specific Ag, Serum: 2.3 ng/mL (ref 0.0–4.0)

## 2021-05-18 NOTE — Telephone Encounter (Signed)
Attempted to contact patient regarding lab results (PSA). No answer @ [phone]. [phone]-voicemail message identified another person.

## 2021-05-24 ENCOUNTER — Telehealth: Payer: Self-pay

## 2021-05-24 NOTE — Telephone Encounter (Signed)
Normal PSA result letter mailed to patient.   May 24, 2021         Dear: Mr. Craig Hunt,   Thank you for your participation in the Prostate Cancer Screening at [facility] on May 17, 2021.   The result of your blood test for the level of the Prostate Specific Antigen (PSA) was found to be normal. It is recommended that continue yearly screening and share these results with your physician.  If you have any questions about these results, please call Fonnie Mu at [phone] or Jannis Atkins at [phone].  Sincerely,     Fonnie Mu, MSN, RN,OCN Oncology [facility]   Clois Dupes, LPN Oncology [facility]

## 2022-05-17 ENCOUNTER — Other Ambulatory Visit: Payer: Self-pay | Admitting: Internal Medicine

## 2022-05-17 DIAGNOSIS — R9389 Abnormal findings on diagnostic imaging of other specified body structures: Secondary | ICD-10-CM

## 2022-06-10 ENCOUNTER — Other Ambulatory Visit: Payer: Self-pay

## 2023-02-28 ENCOUNTER — Emergency Department (HOSPITAL_COMMUNITY)
Admission: EM | Admit: 2023-02-28 | Discharge: 2023-02-28 | Disposition: A | Discharge status: Home / Self Care | Payer: BC Managed Care – PPO | Attending: Emergency Medicine | Admitting: Emergency Medicine

## 2023-02-28 ENCOUNTER — Emergency Department (HOSPITAL_COMMUNITY): Payer: BC Managed Care – PPO

## 2023-02-28 DIAGNOSIS — J069 Acute upper respiratory infection, unspecified: Secondary | ICD-10-CM | POA: Diagnosis not present

## 2023-02-28 DIAGNOSIS — Z87891 Personal history of nicotine dependence: Secondary | ICD-10-CM | POA: Diagnosis not present

## 2023-02-28 DIAGNOSIS — J984 Other disorders of lung: Secondary | ICD-10-CM

## 2023-02-28 DIAGNOSIS — J029 Acute pharyngitis, unspecified: Secondary | ICD-10-CM | POA: Diagnosis present

## 2023-02-28 DIAGNOSIS — J40 Bronchitis, not specified as acute or chronic: Secondary | ICD-10-CM

## 2023-02-28 DIAGNOSIS — J209 Acute bronchitis, unspecified: Secondary | ICD-10-CM | POA: Insufficient documentation

## 2023-02-28 DIAGNOSIS — J8410 Pulmonary fibrosis, unspecified: Secondary | ICD-10-CM | POA: Insufficient documentation

## 2023-02-28 LAB — CBC
HCT: 40.7 % (ref 39.0–52.0)
Hemoglobin: 12.2 g/dL — ABNORMAL LOW (ref 13.0–17.0)
MCH: 29.1 pg (ref 26.0–34.0)
MCHC: 30 g/dL (ref 30.0–36.0)
MCV: 97.1 fL (ref 80.0–100.0)
Platelets: 220 10*3/uL (ref 150–400)
RBC: 4.19 MIL/uL — ABNORMAL LOW (ref 4.22–5.81)
RDW: 13.1 % (ref 11.5–15.5)
WBC: 4.8 10*3/uL (ref 4.0–10.5)
nRBC: 0 % (ref 0.0–0.2)

## 2023-02-28 LAB — BASIC METABOLIC PANEL
Anion gap: 4 — ABNORMAL LOW (ref 5–15)
BUN: 10 mg/dL (ref 8–23)
CO2: 24 mmol/L (ref 22–32)
Calcium: 9 mg/dL (ref 8.9–10.3)
Chloride: 109 mmol/L (ref 98–111)
Creatinine, Ser: 0.81 mg/dL (ref 0.61–1.24)
GFR, Estimated: >60 mL/min (ref >60)
Glucose, Bld: 98 mg/dL (ref 70–99)
Potassium: 4 mmol/L (ref 3.5–5.1)
Sodium: 137 mmol/L (ref 135–145)

## 2023-02-28 LAB — TROPONIN I (HIGH SENSITIVITY): Troponin I (High Sensitivity): 4 ng/L (ref <18)

## 2023-02-28 MED ORDER — ALBUTEROL SULFATE HFA 108 (90 BASE) MCG/ACT IN AERS
2.0000 | INHALATION_SPRAY | Freq: Once | RESPIRATORY_TRACT | Status: AC
  Administered 2023-02-28: 2 via RESPIRATORY_TRACT
  Filled 2023-02-28: qty 6.7

## 2023-02-28 MED ORDER — PREDNISONE 20 MG PO TABS
60.0000 mg | ORAL_TABLET | Freq: Once | ORAL | Status: AC
  Administered 2023-02-28: 60 mg via ORAL
  Filled 2023-02-28: qty 3

## 2023-02-28 MED ORDER — PREDNISONE 10 MG PO TABS: 40.0000 mg | ORAL_TABLET | Freq: Every day | ORAL | 0 refills | Status: AC

## 2023-02-28 NOTE — ED Triage Notes (Signed)
Patient here from urgent care reporting diagnosis of pneumothorax to right side. States that they was able to do chest x-ray.
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
## 2023-02-28 NOTE — ED Notes (Signed)
Pt reports being sent to ED via UC for possible pneumothorax. Pt initially went to the UC for generalized feeling of being unwell and tired. Denies fevers, sob, CP, weakness. Denies recent injury or trauma. Pt in NAD, VSS, A&Ox4, Resp wdl.

## 2023-04-25 ENCOUNTER — Institutional Professional Consult (permissible substitution): Payer: BC Managed Care – PPO | Admitting: Emergency Medicine

## 2023-05-02 ENCOUNTER — Encounter: Payer: Self-pay | Admitting: Emergency Medicine

## 2023-11-09 ENCOUNTER — Encounter (HOSPITAL_COMMUNITY): Payer: Self-pay | Admitting: Emergency Medicine

## 2023-11-09 ENCOUNTER — Emergency Department (HOSPITAL_COMMUNITY)
Admission: EM | Admit: 2023-11-09 | Discharge: 2023-11-09 | Disposition: A | Discharge status: Home / Self Care | Payer: Worker's Compensation

## 2023-11-09 DIAGNOSIS — S058X2A Other injuries of left eye and orbit, initial encounter: Secondary | ICD-10-CM

## 2023-11-09 DIAGNOSIS — W228XXA Striking against or struck by other objects, initial encounter: Secondary | ICD-10-CM | POA: Insufficient documentation

## 2023-11-09 DIAGNOSIS — S0592XA Unspecified injury of left eye and orbit, initial encounter: Secondary | ICD-10-CM | POA: Diagnosis present

## 2023-11-09 MED ORDER — FLUORESCEIN SODIUM 1 MG OP STRP
1.0000 | ORAL_STRIP | Freq: Once | OPHTHALMIC | Status: AC
  Administered 2023-11-09: 1 via OPHTHALMIC
  Filled 2023-11-09: qty 1

## 2023-11-09 MED ORDER — TETRACAINE HCL 0.5 % OP SOLN
1.0000 [drp] | Freq: Once | OPHTHALMIC | Status: AC
  Administered 2023-11-09: 1 [drp] via OPHTHALMIC
  Filled 2023-11-09: qty 4
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
## 2023-11-09 NOTE — Discharge Instructions (Signed)
 Please go to the ophthalmologist office.  They will see you at 1245.
